# Patient Record
Sex: Female | Born: 1980 | Race: White | Hispanic: No | Marital: Single | Smoking: Former smoker
Health system: Southern US, Community
[De-identification: ages and names within clinical notes are randomized; demographics above are authoritative.]

## PROBLEM LIST (undated history)

## (undated) DIAGNOSIS — M419 Scoliosis, unspecified: Secondary | ICD-10-CM

## (undated) DIAGNOSIS — K589 Irritable bowel syndrome without diarrhea: Secondary | ICD-10-CM

## (undated) DIAGNOSIS — N390 Urinary tract infection, site not specified: Secondary | ICD-10-CM

---

## 2001-01-27 ENCOUNTER — Emergency Department (HOSPITAL_COMMUNITY): Admission: EM | Admit: 2001-01-27 | Discharge: 2001-01-27 | Payer: Self-pay | Admitting: Emergency Medicine

## 2005-06-19 ENCOUNTER — Other Ambulatory Visit: Admission: RE | Admit: 2005-06-19 | Discharge: 2005-06-19 | Payer: Self-pay | Admitting: Obstetrics and Gynecology

## 2007-12-21 ENCOUNTER — Inpatient Hospital Stay (HOSPITAL_COMMUNITY): Admission: AD | Admit: 2007-12-21 | Discharge: 2007-12-23 | Payer: Self-pay | Admitting: Obstetrics and Gynecology

## 2008-11-27 HISTORY — PX: SPINE SURGERY: SHX786

## 2009-10-31 ENCOUNTER — Emergency Department (HOSPITAL_BASED_OUTPATIENT_CLINIC_OR_DEPARTMENT_OTHER): Admission: EM | Admit: 2009-10-31 | Discharge: 2009-10-31 | Payer: Self-pay | Admitting: Emergency Medicine

## 2009-10-31 ENCOUNTER — Ambulatory Visit: Payer: Self-pay | Admitting: Interventional Radiology

## 2010-01-25 ENCOUNTER — Emergency Department (HOSPITAL_BASED_OUTPATIENT_CLINIC_OR_DEPARTMENT_OTHER): Admission: EM | Admit: 2010-01-25 | Discharge: 2010-01-25 | Payer: Self-pay | Admitting: Emergency Medicine

## 2010-06-30 ENCOUNTER — Ambulatory Visit: Payer: Self-pay | Admitting: Diagnostic Radiology

## 2010-06-30 ENCOUNTER — Emergency Department (HOSPITAL_BASED_OUTPATIENT_CLINIC_OR_DEPARTMENT_OTHER): Admission: EM | Admit: 2010-06-30 | Discharge: 2010-06-30 | Payer: Self-pay | Admitting: Emergency Medicine

## 2010-11-27 HISTORY — PX: OTHER SURGICAL HISTORY: SHX169

## 2011-02-28 LAB — PREGNANCY, URINE: Preg Test, Ur: NEGATIVE

## 2011-02-28 LAB — DIFFERENTIAL
Basophils Absolute: 0.1 10*3/uL (ref 0.0–0.1)
Basophils Relative: 1 % (ref 0–1)
Eosinophils Absolute: 0.1 10*3/uL (ref 0.0–0.7)
Eosinophils Relative: 1 % (ref 0–5)
Lymphocytes Relative: 26 % (ref 12–46)

## 2011-02-28 LAB — CBC: WBC: 7.7 10*3/uL (ref 4.0–10.5)

## 2011-02-28 LAB — URINALYSIS, ROUTINE W REFLEX MICROSCOPIC
Glucose, UA: NEGATIVE mg/dL
Ketones, ur: NEGATIVE mg/dL
Nitrite: NEGATIVE
Urobilinogen, UA: 0.2 mg/dL (ref 0.0–1.0)

## 2011-02-28 LAB — URINE MICROSCOPIC-ADD ON

## 2011-04-04 ENCOUNTER — Encounter (INDEPENDENT_AMBULATORY_CARE_PROVIDER_SITE_OTHER): Payer: Self-pay | Admitting: Surgery

## 2011-07-24 ENCOUNTER — Ambulatory Visit: Payer: Self-pay | Attending: Neurosurgery | Admitting: Physical Therapy

## 2011-08-15 ENCOUNTER — Ambulatory Visit: Payer: Self-pay | Admitting: Physical Therapy

## 2011-08-17 LAB — DIFFERENTIAL
Basophils Relative: 1
Eosinophils Absolute: 0.5
Lymphocytes Relative: 23
Lymphs Abs: 3.3
Monocytes Absolute: 0.9
Monocytes Relative: 6

## 2011-08-17 LAB — CBC
HCT: 33.9 — ABNORMAL LOW
HCT: 39.3
Hemoglobin: 11.7 — ABNORMAL LOW
MCHC: 34.5
MCV: 94
MCV: 94.7
MCV: 95.8
RBC: 3.58 — ABNORMAL LOW
RDW: 13.2
RDW: 13.3
WBC: 14.6 — ABNORMAL HIGH
WBC: 19.2 — ABNORMAL HIGH
WBC: 20.9 — ABNORMAL HIGH

## 2011-08-23 ENCOUNTER — Ambulatory Visit: Payer: Self-pay | Admitting: Physical Therapy

## 2011-08-30 ENCOUNTER — Ambulatory Visit: Payer: Self-pay | Attending: Neurosurgery | Admitting: Physical Therapy

## 2012-11-26 ENCOUNTER — Emergency Department (HOSPITAL_BASED_OUTPATIENT_CLINIC_OR_DEPARTMENT_OTHER): Payer: Medicaid Other

## 2012-11-26 ENCOUNTER — Encounter (HOSPITAL_BASED_OUTPATIENT_CLINIC_OR_DEPARTMENT_OTHER): Payer: Self-pay

## 2012-11-26 ENCOUNTER — Emergency Department (HOSPITAL_BASED_OUTPATIENT_CLINIC_OR_DEPARTMENT_OTHER)
Admission: EM | Admit: 2012-11-26 | Discharge: 2012-11-26 | Disposition: A | Discharge status: Home / Self Care | Payer: Medicaid Other | Attending: Emergency Medicine | Admitting: Emergency Medicine

## 2012-11-26 DIAGNOSIS — F17200 Nicotine dependence, unspecified, uncomplicated: Secondary | ICD-10-CM | POA: Insufficient documentation

## 2012-11-26 DIAGNOSIS — Z79899 Other long term (current) drug therapy: Secondary | ICD-10-CM | POA: Insufficient documentation

## 2012-11-26 DIAGNOSIS — M549 Dorsalgia, unspecified: Secondary | ICD-10-CM | POA: Insufficient documentation

## 2012-11-26 DIAGNOSIS — Z9889 Other specified postprocedural states: Secondary | ICD-10-CM | POA: Insufficient documentation

## 2012-11-26 MED ORDER — METHOCARBAMOL 500 MG PO TABS
ORAL_TABLET | ORAL | Status: AC
  Administered 2012-11-26: 500 mg
  Filled 2012-11-26: qty 1

## 2012-11-26 MED ORDER — HYDROCODONE-ACETAMINOPHEN 7.5-500 MG/15ML PO SOLN: 15.0000 mL | Freq: Four times a day (QID) | ORAL | Status: DC | PRN

## 2012-11-26 MED ORDER — HYDROCODONE-ACETAMINOPHEN 7.5-500 MG/15ML PO SOLN
ORAL | Status: AC
  Administered 2012-11-26: 15 mL
  Filled 2012-11-26: qty 15

## 2012-11-26 MED ORDER — METHOCARBAMOL 500 MG PO TABS: 500.0000 mg | ORAL_TABLET | Freq: Two times a day (BID) | ORAL | Status: DC

## 2012-11-26 NOTE — ED Provider Notes (Signed)
History     CSN: [id number]  Arrival date & time 11/26/12  1933   First MD Initiated Contact with Patient 11/26/12 2300      Chief Complaint  Patient presents with  . Back Pain    (Consider location/radiation/quality/duration/timing/severity/associated sxs/prior treatment) Patient is a 31 y.o. female presenting with back pain. The history is provided by the patient. No language interpreter was used.  Back Pain  This is a recurrent problem. The current episode started more than 2 days ago. The problem occurs constantly. The problem has not changed since onset.Associated with: pinned up against wall. The pain is present in the thoracic spine. The quality of the pain is described as stabbing. The pain does not radiate. The pain is severe. The symptoms are aggravated by bending. The pain is the same all the time. Pertinent negatives include no chest pain, no fever, no numbness, no weight loss, no headaches, no abdominal pain, no abdominal swelling, no bowel incontinence, no perianal numbness, no bladder incontinence, no dysuria, no pelvic pain, no leg pain, no paresthesias, no paresis, no tingling and no weakness. She has tried nothing for the symptoms. The treatment provided no relief.    History reviewed. No pertinent past medical history.  Past Surgical History  Procedure Date  . Spine surgery 2010    SCOLIOSIS SPINAL FUSION  . Hidranitis boil removal 2012    No family history on file.  History  Substance Use Topics  . Smoking status: Current Every Day Smoker -- 1.0 packs/day  . Smokeless tobacco: Not on file  . Alcohol Use: No    OB History    Grav Para Term Preterm Abortions TAB SAB Ect Mult Living                  Review of Systems  Constitutional: Negative for fever and weight loss.  Cardiovascular: Negative for chest pain.  Gastrointestinal: Negative for abdominal pain and bowel incontinence.  Genitourinary: Negative for bladder incontinence, dysuria and pelvic  pain.  Musculoskeletal: Positive for back pain.  Neurological: Negative for tingling, weakness, numbness, headaches and paresthesias.  All other systems reviewed and are negative.    Allergies  Review of patient's allergies indicates no known allergies.  Home Medications   Current Outpatient Rx  Name  Route  Sig  Dispense  Refill  . ALPRAZOLAM 1 MG PO TABS   Oral   Take 1 mg by mouth 2 (two) times daily.         . OXYCODONE HCL ER 15 MG PO TB12   Oral   Take 15 mg by mouth every 12 (twelve) hours.             BP 132/71  Pulse 89  Temp 98.9 F (37.2 C) (Oral)  Resp 18  Ht 5\' 9"  (1.753 m)  Wt 135 lb (61.236 kg)  BMI 19.94 kg/m2  SpO2 100%  Physical Exam  Constitutional: She is oriented to person, place, and time. She appears well-developed and well-nourished. No distress.  HENT:  Head: Normocephalic and atraumatic.  Mouth/Throat: Oropharynx is clear and moist.  Eyes: Conjunctivae normal and EOM are normal.  Neck: Normal range of motion. Neck supple.  Cardiovascular: Normal rate, regular rhythm and intact distal pulses.   Pulmonary/Chest: Effort normal and breath sounds normal. She has no wheezes. She has no rales.  Abdominal: Soft. Bowel sounds are normal. There is no tenderness. There is no rebound and no guarding.  Musculoskeletal: Normal range of motion. She exhibits  no edema and no tenderness.  Neurological: She is alert and oriented to person, place, and time. She has normal reflexes.  Skin: Skin is warm and dry.  Psychiatric: She has a normal mood and affect.    ED Course  Procedures (including critical care time)  Labs Reviewed - No data to display Dg Cervical Spine Complete  11/26/2012  *RADIOLOGY REPORT*  Clinical Data: Neck injury and neck pain.  CERVICAL SPINE - 4+ VIEWS  Comparison:  06/30/2010  Findings:  There is no evidence of cervical spine fracture or prevertebral soft tissue swelling.  Alignment is normal.  No other significant bone  abnormalities are identified.  IMPRESSION: Negative cervical spine radiographs.   Original Report Authenticated By: Myles Rosenthal, M.D.    Dg Thoracic Spine 2 View  11/26/2012  *RADIOLOGY REPORT*  Clinical Data: Back injury and thoracic back pain.  THORACIC SPINE - 2 VIEW  Comparison: 06/30/2010  Findings: Posterior spinal fixation rods are again seen extending from T2-L2.  No evidence of hardware failure or loosening.  Mild thoracic dextroscoliosis appears stable.  No evidence of thoracic spine fracture or subluxation.  No other significant bone abnormality identified.  IMPRESSION:  1.  No acute findings. 2.  Stable appearance of the posterior spinal fixation rods from levels of T2-L2.   Original Report Authenticated By: Myles Rosenthal, M.D.      No diagnosis found.    MDM  Follow up with your spine provider on Thursday.          Jasmine Awe, MD 11/26/12 2306

## 2012-11-26 NOTE — ED Notes (Signed)
Was pinned by a horse into wall on 12/28-c/o pain to upper back-hx of surgery to area

## 2013-02-09 ENCOUNTER — Emergency Department (HOSPITAL_BASED_OUTPATIENT_CLINIC_OR_DEPARTMENT_OTHER)
Admission: EM | Admit: 2013-02-09 | Discharge: 2013-02-09 | Disposition: A | Discharge status: Home / Self Care | Payer: Medicaid Other | Attending: Emergency Medicine | Admitting: Emergency Medicine

## 2013-02-09 ENCOUNTER — Encounter (HOSPITAL_BASED_OUTPATIENT_CLINIC_OR_DEPARTMENT_OTHER): Payer: Self-pay | Admitting: *Deleted

## 2013-02-09 ENCOUNTER — Emergency Department (HOSPITAL_BASED_OUTPATIENT_CLINIC_OR_DEPARTMENT_OTHER): Payer: Medicaid Other

## 2013-02-09 DIAGNOSIS — Y939 Activity, unspecified: Secondary | ICD-10-CM | POA: Insufficient documentation

## 2013-02-09 DIAGNOSIS — F17200 Nicotine dependence, unspecified, uncomplicated: Secondary | ICD-10-CM | POA: Insufficient documentation

## 2013-02-09 DIAGNOSIS — S0990XA Unspecified injury of head, initial encounter: Secondary | ICD-10-CM | POA: Insufficient documentation

## 2013-02-09 DIAGNOSIS — Z79899 Other long term (current) drug therapy: Secondary | ICD-10-CM | POA: Insufficient documentation

## 2013-02-09 DIAGNOSIS — W1809XA Striking against other object with subsequent fall, initial encounter: Secondary | ICD-10-CM | POA: Insufficient documentation

## 2013-02-09 DIAGNOSIS — Y9289 Other specified places as the place of occurrence of the external cause: Secondary | ICD-10-CM | POA: Insufficient documentation

## 2013-02-09 MED ORDER — OXYCODONE-ACETAMINOPHEN 5-325 MG PO TABS
1.0000 | ORAL_TABLET | Freq: Once | ORAL | Status: AC
  Administered 2013-02-09: 1 via ORAL
  Filled 2013-02-09 (×2): qty 1

## 2013-02-09 MED ORDER — IBUPROFEN 600 MG PO TABS: 600.0000 mg | ORAL_TABLET | Freq: Four times a day (QID) | ORAL | Status: DC | PRN

## 2013-02-09 MED ORDER — ONDANSETRON HCL 4 MG PO TABS: 4.0000 mg | ORAL_TABLET | Freq: Four times a day (QID) | ORAL | Status: DC

## 2013-02-09 NOTE — ED Notes (Signed)
Pt returned from CT °

## 2013-02-09 NOTE — ED Notes (Signed)
Patient transported to CT 

## 2013-02-09 NOTE — ED Provider Notes (Signed)
History     CSN: [id number]  Arrival date & time 02/09/13  0424   First MD Initiated Contact with Patient 02/09/13 [phone]      Chief Complaint  Patient presents with  . Head Injury    (Consider location/radiation/quality/duration/timing/severity/associated sxs/prior treatment) HPI Pt presents with c/o fall and hitting the back of her head.  She states she tripped over some items on her porch causing her to fall.  She states she did have brief LOC, and awoke on the ground.  No report of seizure activity, no vomiting.  Has had intermittent dizziness and headache.  No neck or back pain.  Denies substance use or etoh tonight.  No changes in vision.  She has not had any treatment prior to arrival. There are no other associated systemic symptoms, there are no other alleviating or modifying factors.   History reviewed. No pertinent past medical history.  Past Surgical History  Procedure Laterality Date  . Spine surgery  2010    SCOLIOSIS SPINAL FUSION  . Hidranitis boil removal  2012    No family history on file.  History  Substance Use Topics  . Smoking status: Current Every Day Smoker -- 1.00 packs/day  . Smokeless tobacco: Not on file  . Alcohol Use: No    OB History   Grav Para Term Preterm Abortions TAB SAB Ect Mult Living                  Review of Systems ROS reviewed and all otherwise negative except for mentioned in HPI   Allergies  Review of patient's allergies indicates no known allergies.  Home Medications   Current Outpatient Rx  Name  Route  Sig  Dispense  Refill  . ALPRAZolam (XANAX) 1 MG tablet   Oral   Take 1 mg by mouth 2 (two) times daily.         Marland Kitchen HYDROcodone-acetaminophen (LORTAB) 7.5-500 MG/15ML solution   Oral   Take 15 mLs by mouth every 6 (six) hours as needed for pain.   60 mL   0   . ibuprofen (ADVIL,MOTRIN) 600 MG tablet   Oral   Take 1 tablet (600 mg total) by mouth every 6 (six) hours as needed for pain.   30 tablet   0   .  methocarbamol (ROBAXIN) 500 MG tablet   Oral   Take 1 tablet (500 mg total) by mouth 2 (two) times daily.   20 tablet   0   . ondansetron (ZOFRAN) 4 MG tablet   Oral   Take 1 tablet (4 mg total) by mouth every 6 (six) hours.   12 tablet   0   . oxyCODONE (OXYCONTIN) 15 MG TB12   Oral   Take 15 mg by mouth every 12 (twelve) hours.             BP 108/67  Pulse 80  Temp(Src) 98.2 F (36.8 C) (Oral)  Resp 17  Ht 5\' 9"  (1.753 m)  Wt 150 lb (68.04 kg)  BMI 22.14 kg/m2  SpO2 99% Vitals reviewed Physical Exam Physical Examination: General appearance - alert, well appearing, and in no distress Mental status - alert, oriented to person, place, and time Eyes - pupils equal and reactive, extraocular eye movements intact Ears - bilateral TM's and external ear canals normal, no hemotympanum Neck - no midline tenderness to palpation, FROM without pain Chest - clear to auscultation, no wheezes, rales or rhonchi, symmetric air entry Heart - normal rate,  regular rhythm, normal S1, S2, no murmurs, rubs, clicks or gallops Back exam - no midline tenderness to palpation, no paraspinal tenderness Neurological - alert, oriented x 3, normal speech, strength and sensation grossly intact Musculoskeletal - no joint tenderness, deformity or swelling Extremities - peripheral pulses normal, no pedal edema, no clubbing or cyanosis Skin - normal coloration and turgor, no rashes  ED Course  Procedures (including critical care time)  Labs Reviewed - No data to display Ct Head Wo Contrast  02/09/2013  *RADIOLOGY REPORT*  Clinical Data: The patient fell, striking back of the head. Dizziness and loss of consciousness.  CT HEAD WITHOUT CONTRAST  Technique:  Contiguous axial images were obtained from the base of the skull through the vertex without contrast.  Comparison: None.  Findings: The ventricles and sulci are symmetrical without significant effacement, displacement, or dilatation. No mass effect or  midline shift. No abnormal extra-axial fluid collections. The grey-white matter junction is distinct. Basal cisterns are not effaced. No acute intracranial hemorrhage. No depressed skull fractures.  Visualized mastoid air cells and paranasal sinuses are not opacified.  IMPRESSION: No acute intracranial abnormalities.   Original Report Authenticated By: Burman Nieves, M.D.      1. Head injury, initial encounter       MDM  Pt presenting after fall and head injury with brief LOC.  Head CT reassuring.  Pt advised of postconcussive symptoms.  Given rx for ibuprofen and zofran.  Discharged with strict return precautions.  Pt agreeable with plan.        Ethelda Chick, MD 02/09/13 [phone]

## 2013-02-09 NOTE — ED Notes (Signed)
MD at bedside. 

## 2013-02-09 NOTE — ED Notes (Signed)
Pt c/o of headache. Pt fell and hit the back of her head on a dresser at 0100. Lost consciousness for app 2 minutes. She states dizziness comes and goes. No bruising/bleeding/swelling noted. Denies nausea/vomiting.

## 2013-04-19 ENCOUNTER — Emergency Department (HOSPITAL_BASED_OUTPATIENT_CLINIC_OR_DEPARTMENT_OTHER)
Admission: EM | Admit: 2013-04-19 | Discharge: 2013-04-19 | Disposition: A | Discharge status: Home / Self Care | Payer: Medicaid Other | Attending: Emergency Medicine | Admitting: Emergency Medicine

## 2013-04-19 ENCOUNTER — Encounter (HOSPITAL_BASED_OUTPATIENT_CLINIC_OR_DEPARTMENT_OTHER): Payer: Self-pay

## 2013-04-19 DIAGNOSIS — R509 Fever, unspecified: Secondary | ICD-10-CM | POA: Insufficient documentation

## 2013-04-19 DIAGNOSIS — R1310 Dysphagia, unspecified: Secondary | ICD-10-CM | POA: Insufficient documentation

## 2013-04-19 DIAGNOSIS — J029 Acute pharyngitis, unspecified: Secondary | ICD-10-CM | POA: Insufficient documentation

## 2013-04-19 DIAGNOSIS — R5381 Other malaise: Secondary | ICD-10-CM | POA: Insufficient documentation

## 2013-04-19 DIAGNOSIS — R599 Enlarged lymph nodes, unspecified: Secondary | ICD-10-CM | POA: Insufficient documentation

## 2013-04-19 DIAGNOSIS — Z79899 Other long term (current) drug therapy: Secondary | ICD-10-CM | POA: Insufficient documentation

## 2013-04-19 DIAGNOSIS — F17200 Nicotine dependence, unspecified, uncomplicated: Secondary | ICD-10-CM | POA: Insufficient documentation

## 2013-04-19 MED ORDER — PREDNISONE 50 MG PO TABS
60.0000 mg | ORAL_TABLET | Freq: Once | ORAL | Status: AC
  Administered 2013-04-19: 60 mg via ORAL
  Filled 2013-04-19: qty 1

## 2013-04-19 NOTE — ED Notes (Signed)
Pt states that she has severe sore throat, believes she has tonsillitis.  Pt states that she cannot swallow because of it, but is drinking a soft drink.  Pt states that she was going to go to [facility] on Monday, but decided to come to ED because she felt like she couldn't breathe.

## 2013-04-19 NOTE — ED Provider Notes (Signed)
History     CSN: [id number]  Arrival date & time 04/19/13  1349   First MD Initiated Contact with Patient 04/19/13 1411      Chief Complaint  Patient presents with  . Sore Throat    (Consider location/radiation/quality/duration/timing/severity/associated sxs/prior treatment) HPI Comments: Patient presents with a 2 to three-day history of sore throat. She states it's been getting worse over the last 2 days. She's had some subjective fevers. She denies any runny nose or nasal congestion. She denies any cough or chest congestion. She's having trouble swallowing but no trouble controlling her secretions. She denies any rashes. She denies abdominal pain.  Patient is a 32 y.o. female presenting with pharyngitis.  Sore Throat Pertinent negatives include no chest pain, no abdominal pain, no headaches and no shortness of breath.    History reviewed. No pertinent past medical history.  Past Surgical History  Procedure Laterality Date  . Spine surgery  2010    SCOLIOSIS SPINAL FUSION  . Hidranitis boil removal  2012    History reviewed. No pertinent family history.  History  Substance Use Topics  . Smoking status: Current Every Day Smoker -- 1.00 packs/day    Types: Cigarettes  . Smokeless tobacco: Never Used  . Alcohol Use: No    OB History   Grav Para Term Preterm Abortions TAB SAB Ect Mult Living                  Review of Systems  Constitutional: Positive for fever and fatigue. Negative for chills and diaphoresis.  HENT: Positive for sore throat and trouble swallowing. Negative for congestion, rhinorrhea, sneezing, mouth sores and voice change.   Eyes: Negative.   Respiratory: Negative for cough, chest tightness and shortness of breath.   Cardiovascular: Negative for chest pain and leg swelling.  Gastrointestinal: Negative for nausea, vomiting, abdominal pain, diarrhea and blood in stool.  Genitourinary: Negative for frequency, hematuria, flank pain and difficulty  urinating.  Musculoskeletal: Negative for back pain and arthralgias.  Skin: Negative for rash.  Neurological: Negative for dizziness, speech difficulty, weakness, numbness and headaches.    Allergies  Review of patient's allergies indicates no known allergies.  Home Medications   Current Outpatient Rx  Name  Route  Sig  Dispense  Refill  . ALPRAZolam (XANAX) 1 MG tablet   Oral   Take 1 mg by mouth 2 (two) times daily.         Marland Kitchen oxyCODONE (OXYCONTIN) 15 MG TB12   Oral   Take 15 mg by mouth every 12 (twelve) hours.           Marland Kitchen HYDROcodone-acetaminophen (LORTAB) 7.5-500 MG/15ML solution   Oral   Take 15 mLs by mouth every 6 (six) hours as needed for pain.   60 mL   0   . ibuprofen (ADVIL,MOTRIN) 600 MG tablet   Oral   Take 1 tablet (600 mg total) by mouth every 6 (six) hours as needed for pain.   30 tablet   0   . methocarbamol (ROBAXIN) 500 MG tablet   Oral   Take 1 tablet (500 mg total) by mouth 2 (two) times daily.   20 tablet   0   . ondansetron (ZOFRAN) 4 MG tablet   Oral   Take 1 tablet (4 mg total) by mouth every 6 (six) hours.   12 tablet   0     BP 123/79  Pulse 106  Temp(Src) 98.8 F (37.1 C) (Oral)  Resp 20  Ht 5\' 9"  (1.753 m)  Wt 145 lb (65.772 kg)  BMI 21.4 kg/m2  SpO2 96%  Physical Exam  Constitutional: She is oriented to person, place, and time. She appears well-developed and well-nourished.  HENT:  Head: Normocephalic and atraumatic.  Right Ear: External ear normal.  Left Ear: External ear normal.  Patient with enlargement of the tonsils bilaterally. There is exudate bilaterally. Uvula is midline with no fullness in the peritonsillar area. There is mild cervical lymphadenopathy bilaterally.  Eyes: Conjunctivae are normal. Pupils are equal, round, and reactive to light.  Neck: Normal range of motion. Neck supple.  Cardiovascular: Normal rate, regular rhythm and normal heart sounds.   Pulmonary/Chest: Effort normal and breath sounds  normal. No respiratory distress. She has no wheezes. She has no rales. She exhibits no tenderness.  Abdominal: Soft. Bowel sounds are normal. There is no tenderness. There is no rebound and no guarding.  Musculoskeletal: Normal range of motion. She exhibits no edema.  Lymphadenopathy:    She has cervical adenopathy.  Neurological: She is alert and oriented to person, place, and time.  Skin: Skin is warm and dry. No rash noted.  Psychiatric: She has a normal mood and affect.    ED Course  Procedures (including critical care time)  Results for orders placed during the hospital encounter of 04/19/13  RAPID STREP SCREEN      Result Value Range   Streptococcus, Group A Screen (Direct) NEGATIVE  NEGATIVE   No results found.    1. Pharyngitis       MDM  Patient's rapid strep is negative. It was sent for culture. She was given a dose of steroids she her in the ED for inflammation. She's not given a prescription for steroids. She was advised to continue her saltwater gargles and to followup with her Dr. on Tuesday if her symptoms are not improved. She is advised to return here for symptoms worsen.        Rolan Bucco, MD 04/19/13 [phone]

## 2013-04-21 LAB — CULTURE, GROUP A STREP

## 2013-11-25 ENCOUNTER — Ambulatory Visit: Payer: Medicaid Other | Attending: Anesthesiology

## 2014-01-17 ENCOUNTER — Emergency Department (HOSPITAL_BASED_OUTPATIENT_CLINIC_OR_DEPARTMENT_OTHER)
Admission: EM | Admit: 2014-01-17 | Discharge: 2014-01-18 | Disposition: A | Discharge status: Home / Self Care | Payer: Medicaid Other | Attending: Emergency Medicine | Admitting: Emergency Medicine

## 2014-01-17 ENCOUNTER — Encounter (HOSPITAL_BASED_OUTPATIENT_CLINIC_OR_DEPARTMENT_OTHER): Payer: Self-pay | Admitting: Emergency Medicine

## 2014-01-17 DIAGNOSIS — Z975 Presence of (intrauterine) contraceptive device: Secondary | ICD-10-CM | POA: Insufficient documentation

## 2014-01-17 DIAGNOSIS — F17200 Nicotine dependence, unspecified, uncomplicated: Secondary | ICD-10-CM | POA: Insufficient documentation

## 2014-01-17 DIAGNOSIS — N390 Urinary tract infection, site not specified: Secondary | ICD-10-CM | POA: Insufficient documentation

## 2014-01-17 DIAGNOSIS — Z79899 Other long term (current) drug therapy: Secondary | ICD-10-CM | POA: Insufficient documentation

## 2014-01-17 DIAGNOSIS — Z3202 Encounter for pregnancy test, result negative: Secondary | ICD-10-CM | POA: Insufficient documentation

## 2014-01-17 NOTE — ED Notes (Signed)
C/o onset burning, frequency with urination.  C/o right sided flank pain.  Denies fever, nausea, vomiting.  Denies abnormal vaginal discharge.

## 2014-01-17 NOTE — ED Provider Notes (Signed)
CSN: [id number]     Arrival date & time 01/17/14  2346 History  This chart was scribed for Geoffery Lyons, MD by Dorothey Baseman, ED Scribe. This patient was seen in room MH04/MH04 and the patient's care was started at 12:09 AM.    Chief Complaint  Patient presents with  . Dysuria   The history is provided by the patient. No language interpreter was used.   HPI Comments: Julie Pratt is a 33 y.o. female who presents to the Emergency Department complaining of burning dysuria with associated urgency, right flank pain, and diffuse abdominal pain onset a few days ago. She reports taking Azo at home without significant relief. She denies fever, emesis. Patient reports that she uses Mirena IUD for birth control and subsequently has not had a menstrual period for several years. Patient has no other pertinent medical history.   History reviewed. No pertinent past medical history. Past Surgical History  Procedure Laterality Date  . Spine surgery  2010    SCOLIOSIS SPINAL FUSION  . Hidranitis boil removal  2012   No family history on file. History  Substance Use Topics  . Smoking status: Current Every Day Smoker -- 1.00 packs/day    Types: Cigarettes  . Smokeless tobacco: Never Used  . Alcohol Use: No   OB History   Grav Para Term Preterm Abortions TAB SAB Ect Mult Living                 Review of Systems  A complete 10 system review of systems was obtained and all systems are negative except as noted in the HPI and PMH.    Allergies  Review of patient's allergies indicates no known allergies.  Home Medications   Current Outpatient Rx  Name  Route  Sig  Dispense  Refill  . ALPRAZolam (XANAX) 1 MG tablet   Oral   Take 1 mg by mouth 2 (two) times daily.         Marland Kitchen HYDROcodone-acetaminophen (LORTAB) 7.5-500 MG/15ML solution   Oral   Take 15 mLs by mouth every 6 (six) hours as needed for pain.   60 mL   0   . ibuprofen (ADVIL,MOTRIN) 600 MG tablet   Oral   Take 1 tablet  (600 mg total) by mouth every 6 (six) hours as needed for pain.   30 tablet   0   . methocarbamol (ROBAXIN) 500 MG tablet   Oral   Take 1 tablet (500 mg total) by mouth 2 (two) times daily.   20 tablet   0   . ondansetron (ZOFRAN) 4 MG tablet   Oral   Take 1 tablet (4 mg total) by mouth every 6 (six) hours.   12 tablet   0   . oxyCODONE (OXYCONTIN) 15 MG TB12   Oral   Take 15 mg by mouth every 12 (twelve) hours.            Triage Vitals: BP 106/72  Pulse 104  Temp(Src) 97.7 F (36.5 C) (Oral)  Resp 20  Ht 5\' 9"  (1.753 m)  Wt 145 lb (65.772 kg)  BMI 21.40 kg/m2  SpO2 96%  Physical Exam  Nursing note and vitals reviewed. Constitutional: She is oriented to person, place, and time. She appears well-developed and well-nourished. No distress.  HENT:  Head: Normocephalic and atraumatic.  Eyes: Conjunctivae are normal.  Neck: Normal range of motion. Neck supple.  Cardiovascular: Normal rate, regular rhythm and normal heart sounds.   Pulmonary/Chest: Effort normal  and breath sounds normal. No respiratory distress.  Abdominal: Soft. Bowel sounds are normal. She exhibits no distension. There is no tenderness.  No CVA tenderness.   Musculoskeletal: Normal range of motion.  Neurological: She is alert and oriented to person, place, and time.  Skin: Skin is warm and dry.  Psychiatric: She has a normal mood and affect. Her behavior is normal.    ED Course  Procedures (including critical care time)  DIAGNOSTIC STUDIES: Oxygen Saturation is 96% on room air, normal by my interpretation.    COORDINATION OF CARE: 12:11 AM- Ordered UA. Discussed treatment plan with patient at bedside and patient verbalized agreement.     Labs Review Labs Reviewed  URINALYSIS, ROUTINE W REFLEX MICROSCOPIC  PREGNANCY, URINE   Imaging Review No results found.    MDM   Final diagnoses:  None    Patient is a 33 year old female who presents with dysuria, burning, frequency. She's had  no relief with azo otc.  She denies fevers or chills. UA reveals a UTI. We'll treat with Bactrim pyridium. She is to return as needed for any problems.  I personally performed the services described in this documentation, which was scribed in my presence. The recorded information has been reviewed and is accurate.       Geoffery Lyonsouglas Dalon Reichart, MD 01/18/14 [phone]

## 2014-01-18 LAB — URINE MICROSCOPIC-ADD ON

## 2014-01-18 LAB — URINALYSIS, ROUTINE W REFLEX MICROSCOPIC
GLUCOSE, UA: NEGATIVE mg/dL
Ketones, ur: 40 mg/dL — AB
Nitrite: POSITIVE — AB
PH: 5 (ref 5.0–8.0)
SPECIFIC GRAVITY, URINE: 1.029 (ref 1.005–1.030)
UROBILINOGEN UA: 2 mg/dL — AB (ref 0.0–1.0)

## 2014-01-18 LAB — PREGNANCY, URINE: PREG TEST UR: NEGATIVE

## 2014-01-18 MED ORDER — PHENAZOPYRIDINE HCL 200 MG PO TABS: 200.0000 mg | ORAL_TABLET | Freq: Three times a day (TID) | ORAL | Status: DC | PRN

## 2014-01-18 MED ORDER — HYDROCODONE-ACETAMINOPHEN 5-325 MG PO TABS: 2.0000 | ORAL_TABLET | ORAL | Status: DC | PRN

## 2014-01-18 MED ORDER — SULFAMETHOXAZOLE-TRIMETHOPRIM 800-160 MG PO TABS: 1.0000 | ORAL_TABLET | Freq: Two times a day (BID) | ORAL | Status: AC

## 2014-01-18 NOTE — ED Notes (Signed)
Pt states that she has been having symptoms of frequency, pressure when urinating and painful urination. Pt has been taking Azos to help with the pain but still having burning.

## 2014-01-18 NOTE — Discharge Instructions (Signed)
Bactrim and Pyridium as prescribed.  Return to the Emergency Department for severe pain, high fever, other new and concerning symptoms.   Urinary Tract Infection Urinary tract infections (UTIs) can develop anywhere along your urinary tract. Your urinary tract is your body's drainage system for removing wastes and extra water. Your urinary tract includes two kidneys, two ureters, a bladder, and a urethra. Your kidneys are a pair of bean-shaped organs. Each kidney is about the size of your fist. They are located below your ribs, one on each side of your spine. CAUSES Infections are caused by microbes, which are microscopic organisms, including fungi, viruses, and bacteria. These organisms are so small that they can only be seen through a microscope. Bacteria are the microbes that most commonly cause UTIs. SYMPTOMS  Symptoms of UTIs may vary by age and gender of the patient and by the location of the infection. Symptoms in young women typically include a frequent and intense urge to urinate and a painful, burning feeling in the bladder or urethra during urination. Older women and men are more likely to be tired, shaky, and weak and have muscle aches and abdominal pain. A fever may mean the infection is in your kidneys. Other symptoms of a kidney infection include pain in your back or sides below the ribs, nausea, and vomiting. DIAGNOSIS To diagnose a UTI, your caregiver will ask you about your symptoms. Your caregiver also will ask to provide a urine sample. The urine sample will be tested for bacteria and white blood cells. White blood cells are made by your body to help fight infection. TREATMENT  Typically, UTIs can be treated with medication. Because most UTIs are caused by a bacterial infection, they usually can be treated with the use of antibiotics. The choice of antibiotic and length of treatment depend on your symptoms and the type of bacteria causing your infection. HOME CARE INSTRUCTIONS  If  you were prescribed antibiotics, take them exactly as your caregiver instructs you. Finish the medication even if you feel better after you have only taken some of the medication.  Drink enough water and fluids to keep your urine clear or pale yellow.  Avoid caffeine, tea, and carbonated beverages. They tend to irritate your bladder.  Empty your bladder often. Avoid holding urine for long periods of time.  Empty your bladder before and after sexual intercourse.  After a bowel movement, women should cleanse from front to back. Use each tissue only once. SEEK MEDICAL CARE IF:   You have back pain.  You develop a fever.  Your symptoms do not begin to resolve within 3 days. SEEK IMMEDIATE MEDICAL CARE IF:   You have severe back pain or lower abdominal pain.  You develop chills.  You have nausea or vomiting.  You have continued burning or discomfort with urination. MAKE SURE YOU:   Understand these instructions.  Will watch your condition.  Will get help right away if you are not doing well or get worse. Document Released: 08/23/2005 Document Revised: 05/14/2012 Document Reviewed: 12/22/2011 [facility] Patient Information 2014 [employer] [place], [place].

## 2014-01-21 ENCOUNTER — Encounter (HOSPITAL_BASED_OUTPATIENT_CLINIC_OR_DEPARTMENT_OTHER): Payer: Self-pay | Admitting: Emergency Medicine

## 2014-01-21 ENCOUNTER — Emergency Department (HOSPITAL_BASED_OUTPATIENT_CLINIC_OR_DEPARTMENT_OTHER)
Admission: EM | Admit: 2014-01-21 | Discharge: 2014-01-21 | Disposition: A | Discharge status: Home / Self Care | Payer: Medicaid Other | Attending: Emergency Medicine | Admitting: Emergency Medicine

## 2014-01-21 DIAGNOSIS — F17200 Nicotine dependence, unspecified, uncomplicated: Secondary | ICD-10-CM | POA: Insufficient documentation

## 2014-01-21 DIAGNOSIS — Z79899 Other long term (current) drug therapy: Secondary | ICD-10-CM | POA: Insufficient documentation

## 2014-01-21 DIAGNOSIS — G8929 Other chronic pain: Secondary | ICD-10-CM | POA: Insufficient documentation

## 2014-01-21 DIAGNOSIS — Z9889 Other specified postprocedural states: Secondary | ICD-10-CM | POA: Insufficient documentation

## 2014-01-21 DIAGNOSIS — A499 Bacterial infection, unspecified: Secondary | ICD-10-CM | POA: Insufficient documentation

## 2014-01-21 DIAGNOSIS — Z3202 Encounter for pregnancy test, result negative: Secondary | ICD-10-CM | POA: Insufficient documentation

## 2014-01-21 DIAGNOSIS — Z87440 Personal history of urinary (tract) infections: Secondary | ICD-10-CM | POA: Insufficient documentation

## 2014-01-21 DIAGNOSIS — B9689 Other specified bacterial agents as the cause of diseases classified elsewhere: Secondary | ICD-10-CM | POA: Insufficient documentation

## 2014-01-21 DIAGNOSIS — M549 Dorsalgia, unspecified: Secondary | ICD-10-CM | POA: Insufficient documentation

## 2014-01-21 DIAGNOSIS — N760 Acute vaginitis: Secondary | ICD-10-CM | POA: Insufficient documentation

## 2014-01-21 HISTORY — DX: Scoliosis, unspecified: M41.9

## 2014-01-21 LAB — PREGNANCY, URINE: Preg Test, Ur: NEGATIVE

## 2014-01-21 LAB — URINALYSIS, ROUTINE W REFLEX MICROSCOPIC
BILIRUBIN URINE: NEGATIVE
GLUCOSE, UA: NEGATIVE mg/dL
HGB URINE DIPSTICK: NEGATIVE
KETONES UR: NEGATIVE mg/dL
Leukocytes, UA: NEGATIVE
Nitrite: NEGATIVE
PROTEIN: NEGATIVE mg/dL
Specific Gravity, Urine: 1.013 (ref 1.005–1.030)
Urobilinogen, UA: 1 mg/dL (ref 0.0–1.0)
pH: 7.5 (ref 5.0–8.0)

## 2014-01-21 LAB — WET PREP, GENITAL
TRICH WET PREP: NONE SEEN
Yeast Wet Prep HPF POC: NONE SEEN

## 2014-01-21 MED ORDER — METRONIDAZOLE 500 MG PO TABS: 500.0000 mg | ORAL_TABLET | Freq: Two times a day (BID) | ORAL | Status: DC

## 2014-01-21 MED ORDER — OXYCODONE-ACETAMINOPHEN 5-325 MG PO TABS: 1.0000 | ORAL_TABLET | ORAL | Status: DC | PRN

## 2014-01-21 NOTE — ED Provider Notes (Signed)
CSN: [id number]     Arrival date & time 01/21/14  1159 History   First MD Initiated Contact with Patient 01/21/14 1204     Chief Complaint  Patient presents with  . Back Pain     (Consider location/radiation/quality/duration/timing/severity/associated sxs/prior Treatment) HPI Comments: Pt states that she was seen for a uti 3 days ago in the er and the symptoms weren't getting any better. Pt states that she has some burning still and lower abdominal pressure. Pt states that she is also having some back pain but she has chronic back pain associated with her surgery for sciolosis and her rods. Denies fever, numbness or weakness  The history is provided by the patient. No language interpreter was used.    Past Medical History  Diagnosis Date  . Scoliosis    Past Surgical History  Procedure Laterality Date  . Spine surgery  2010    SCOLIOSIS SPINAL FUSION  . Hidranitis boil removal  2012   No family history on file. History  Substance Use Topics  . Smoking status: Current Every Day Smoker -- 1.00 packs/day    Types: Cigarettes  . Smokeless tobacco: Never Used  . Alcohol Use: No   OB History   Grav Para Term Preterm Abortions TAB SAB Ect Mult Living                 Review of Systems  Constitutional: Negative.   Respiratory: Negative.   Cardiovascular: Negative.       Allergies  Review of patient's allergies indicates no known allergies.  Home Medications   Current Outpatient Rx  Name  Route  Sig  Dispense  Refill  . ALPRAZolam (XANAX) 1 MG tablet   Oral   Take 1 mg by mouth 2 (two) times daily.         Marland Kitchen. HYDROcodone-acetaminophen (NORCO) 5-325 MG per tablet   Oral   Take 2 tablets by mouth every 4 (four) hours as needed.   10 tablet   0   . phenazopyridine (PYRIDIUM) 200 MG tablet   Oral   Take 1 tablet (200 mg total) by mouth 3 (three) times daily as needed for pain.   6 tablet   0   . sulfamethoxazole-trimethoprim (BACTRIM DS,SEPTRA DS) 800-160 MG  per tablet   Oral   Take 1 tablet by mouth 2 (two) times daily.   10 tablet   0   . HYDROcodone-acetaminophen (LORTAB) 7.5-500 MG/15ML solution   Oral   Take 15 mLs by mouth every 6 (six) hours as needed for pain.   60 mL   0   . ibuprofen (ADVIL,MOTRIN) 600 MG tablet   Oral   Take 1 tablet (600 mg total) by mouth every 6 (six) hours as needed for pain.   30 tablet   0   . methocarbamol (ROBAXIN) 500 MG tablet   Oral   Take 1 tablet (500 mg total) by mouth 2 (two) times daily.   20 tablet   0   . metroNIDAZOLE (FLAGYL) 500 MG tablet   Oral   Take 1 tablet (500 mg total) by mouth 2 (two) times daily.   14 tablet   0   . ondansetron (ZOFRAN) 4 MG tablet   Oral   Take 1 tablet (4 mg total) by mouth every 6 (six) hours.   12 tablet   0   . oxyCODONE (OXYCONTIN) 15 MG TB12   Oral   Take 15 mg by mouth every 12 (twelve) hours.           .Marland Kitchen  oxyCODONE-acetaminophen (PERCOCET/ROXICET) 5-325 MG per tablet   Oral   Take 1-2 tablets by mouth every 4 (four) hours as needed for severe pain.   6 tablet   0    BP 131/74  Pulse 73  Temp(Src) 98.3 F (36.8 C) (Oral)  Resp 18  Ht 5\' 9"  (1.753 m)  Wt 145 lb (65.772 kg)  BMI 21.40 kg/m2  SpO2 98% Physical Exam  Nursing note and vitals reviewed. Constitutional: She is oriented to person, place, and time. She appears well-developed and well-nourished.  HENT:  Head: Normocephalic and atraumatic.  Cardiovascular: Normal rate.   Pulmonary/Chest: Effort normal and breath sounds normal.  Abdominal: Soft. Bowel sounds are normal.  Suprapubic tenderness  Genitourinary:  Manson Passey discharge  Musculoskeletal: Normal range of motion.  Lumbar paraspinal tenderness  Neurological: She is alert and oriented to person, place, and time. Coordination normal.  Skin: Skin is warm and dry.    ED Course  Procedures (including critical care time) Labs Review Labs Reviewed  WET PREP, GENITAL - Abnormal; Notable for the following:    Clue  Cells Wet Prep HPF POC FEW (*)    WBC, Wet Prep HPF POC MANY (*)    All other components within normal limits  GC/CHLAMYDIA PROBE AMP  PREGNANCY, URINE  URINALYSIS, ROUTINE W REFLEX MICROSCOPIC   Imaging Review No results found.  EKG Interpretation   None       MDM   Final diagnoses:  Back pain  BV (bacterial vaginosis)    Will treat with something for pain and for ZO:XWRUE is better:back pain likely to chronic pain in back    Teressa Lower, NP 01/21/14 1328

## 2014-01-21 NOTE — ED Notes (Signed)
D/c homw with rx x 2 given for percocet and flagyl

## 2014-01-21 NOTE — ED Notes (Signed)
Back pain increasing last night- also recent dx with UTI but states sx not improving

## 2014-01-21 NOTE — Discharge Instructions (Signed)
Back Pain, Adult Low back pain is very common. About 1 in 5 people have back pain.The cause of low back pain is rarely dangerous. The pain often gets better over time.About half of people with a sudden onset of back pain feel better in just 2 weeks. About 8 in 10 people feel better by 6 weeks.  CAUSES Some common causes of back pain include:  Strain of the muscles or ligaments supporting the spine.  Wear and tear (degeneration) of the spinal discs.  Arthritis.  Direct injury to the back. DIAGNOSIS Most of the time, the direct cause of low back pain is not known.However, back pain can be treated effectively even when the exact cause of the pain is unknown.Answering your caregiver's questions about your overall health and symptoms is one of the most accurate ways to make sure the cause of your pain is not dangerous. If your caregiver needs more information, he or she may order lab work or imaging tests (X-rays or MRIs).However, even if imaging tests show changes in your back, this usually does not require surgery. HOME CARE INSTRUCTIONS For many people, back pain returns.Since low back pain is rarely dangerous, it is often a condition that people can learn to [facility] their own.   Remain active. It is stressful on the back to sit or stand in one place. Do not sit, drive, or stand in one place for more than 30 minutes at a time. Take short walks on level surfaces as soon as pain allows.Try to increase the length of time you walk each day.  Do not stay in bed.Resting more than 1 or 2 days can delay your recovery.  Do not avoid exercise or work.Your body is made to move.It is not dangerous to be active, even though your back may hurt.Your back will likely heal faster if you return to being active before your pain is gone.  Pay attention to your body when you bend and lift. Many people have less discomfortwhen lifting if they bend their knees, keep the load close to their bodies,and  avoid twisting. Often, the most comfortable positions are those that put less stress on your recovering back.  Find a comfortable position to sleep. Use a firm mattress and lie on your side with your knees slightly bent. If you lie on your back, put a pillow under your knees.  Only take over-the-counter or prescription medicines as directed by your caregiver. Over-the-counter medicines to reduce pain and inflammation are often the most helpful.Your caregiver may prescribe muscle relaxant drugs.These medicines help dull your pain so you can more quickly return to your normal activities and healthy exercise.  Put ice on the injured area.  Put ice in a plastic bag.  Place a towel between your skin and the bag.  Leave the ice on for 15-20 minutes, 03-04 times a day for the first 2 to 3 days. After that, ice and heat may be alternated to reduce pain and spasms.  Ask your caregiver about trying back exercises and gentle massage. This may be of some benefit.  Avoid feeling anxious or stressed.Stress increases muscle tension and can worsen back pain.It is important to recognize when you are anxious or stressed and learn ways to manage it.Exercise is a great option. SEEK MEDICAL CARE IF:  You have pain that is not relieved with rest or medicine.  You have pain that does not improve in 1 week.  You have new symptoms.  You are generally not feeling well. SEEK  IMMEDIATE MEDICAL CARE IF:   You have pain that radiates from your back into your legs.  You develop new bowel or bladder control problems.  You have unusual weakness or numbness in your arms or legs.  You develop nausea or vomiting.  You develop abdominal pain.  You feel faint. Document Released: 11/13/2005 Document Revised: 05/14/2012 Document Reviewed: 04/03/2011 [facility]ExitCare Patient Information 2014 [place]ExitCare, [place].  Bacterial Vaginosis Bacterial vaginosis is a vaginal infection that occurs when the normal balance of  bacteria in the vagina is disrupted. It results from an overgrowth of certain bacteria. This is the most common vaginal infection in women of childbearing age. Treatment is important to prevent complications, especially in pregnant women, as it can cause a premature delivery. CAUSES  Bacterial vaginosis is caused by an increase in harmful bacteria that are normally present in smaller amounts in the vagina. Several different kinds of bacteria can cause bacterial vaginosis. However, the reason that the condition develops is not fully understood. RISK FACTORS Certain activities or behaviors can put you at an increased risk of developing bacterial vaginosis, including:  Having a new sex partner or multiple sex partners.  Douching.  Using an intrauterine device (IUD) for contraception. Women do not get bacterial vaginosis from toilet seats, bedding, swimming pools, or contact with objects around them. SIGNS AND SYMPTOMS  Some women with bacterial vaginosis have no signs or symptoms. Common symptoms include:  Grey vaginal discharge.  A fishlike odor with discharge, especially after sexual intercourse.  Itching or burning of the vagina and vulva.  Burning or pain with urination. DIAGNOSIS  Your health care provider will take a medical history and examine the vagina for signs of bacterial vaginosis. A sample of vaginal fluid may be taken. Your health care provider will look at this sample under a microscope to check for bacteria and abnormal cells. A vaginal pH test may also be done.  TREATMENT  Bacterial vaginosis may be treated with antibiotic medicines. These may be given in the form of a pill or a vaginal cream. A second round of antibiotics may be prescribed if the condition comes back after treatment.  HOME CARE INSTRUCTIONS   Only take over-the-counter or prescription medicines as directed by your health care provider.  If antibiotic medicine was prescribed, take it as directed. Make  sure you finish it even if you start to feel better.  Do not have sex until treatment is completed.  Tell all sexual partners that you have a vaginal infection. They should see their health care provider and be treated if they have problems, such as a mild rash or itching.  Practice safe sex by using condoms and only having one sex partner. SEEK MEDICAL CARE IF:   Your symptoms are not improving after 3 days of treatment.  You have increased discharge or pain.  You have a fever. MAKE SURE YOU:   Understand these instructions.  Will watch your condition.  Will get help right away if you are not doing well or get worse. FOR MORE INFORMATION  Centers for Disease Control and Prevention, Division of STD Prevention: SolutionApps.co.zawww.cdc.gov/std American Sexual Health Association (ASHA): www.ashastd.org  Document Released: 11/13/2005 Document Revised: 09/03/2013 Document Reviewed: 06/25/2013 [facility]ExitCare Patient Information 2014 [place], [place]LL[place].

## 2014-01-21 NOTE — ED Provider Notes (Signed)
Medical screening examination/treatment/procedure(s) were performed by non-physician practitioner and as supervising physician I was immediately available for consultation/collaboration.  EKG Interpretation   None         Dagmar HaitWilliam Chigozie Basaldua, MD 01/21/14 1438

## 2014-01-22 LAB — GC/CHLAMYDIA PROBE AMP
CT Probe RNA: NEGATIVE
GC Probe RNA: NEGATIVE

## 2014-03-11 ENCOUNTER — Emergency Department (HOSPITAL_BASED_OUTPATIENT_CLINIC_OR_DEPARTMENT_OTHER)
Admission: EM | Admit: 2014-03-11 | Discharge: 2014-03-11 | Disposition: A | Discharge status: Home / Self Care | Payer: Medicaid Other | Attending: Emergency Medicine | Admitting: Emergency Medicine

## 2014-03-11 ENCOUNTER — Encounter (HOSPITAL_BASED_OUTPATIENT_CLINIC_OR_DEPARTMENT_OTHER): Payer: Self-pay | Admitting: Emergency Medicine

## 2014-03-11 DIAGNOSIS — Z8739 Personal history of other diseases of the musculoskeletal system and connective tissue: Secondary | ICD-10-CM | POA: Insufficient documentation

## 2014-03-11 DIAGNOSIS — Z8719 Personal history of other diseases of the digestive system: Secondary | ICD-10-CM | POA: Insufficient documentation

## 2014-03-11 DIAGNOSIS — N390 Urinary tract infection, site not specified: Secondary | ICD-10-CM | POA: Insufficient documentation

## 2014-03-11 DIAGNOSIS — Z3202 Encounter for pregnancy test, result negative: Secondary | ICD-10-CM | POA: Insufficient documentation

## 2014-03-11 DIAGNOSIS — F17200 Nicotine dependence, unspecified, uncomplicated: Secondary | ICD-10-CM | POA: Insufficient documentation

## 2014-03-11 DIAGNOSIS — Z792 Long term (current) use of antibiotics: Secondary | ICD-10-CM | POA: Insufficient documentation

## 2014-03-11 DIAGNOSIS — Z79899 Other long term (current) drug therapy: Secondary | ICD-10-CM | POA: Insufficient documentation

## 2014-03-11 HISTORY — DX: Irritable bowel syndrome, unspecified: K58.9

## 2014-03-11 HISTORY — DX: Urinary tract infection, site not specified: N39.0

## 2014-03-11 LAB — URINALYSIS, ROUTINE W REFLEX MICROSCOPIC
BILIRUBIN URINE: NEGATIVE
Glucose, UA: NEGATIVE mg/dL
Hgb urine dipstick: NEGATIVE
KETONES UR: NEGATIVE mg/dL
NITRITE: NEGATIVE
PROTEIN: NEGATIVE mg/dL
Specific Gravity, Urine: 1.005 (ref 1.005–1.030)
Urobilinogen, UA: 0.2 mg/dL (ref 0.0–1.0)
pH: 7 (ref 5.0–8.0)

## 2014-03-11 LAB — URINE MICROSCOPIC-ADD ON

## 2014-03-11 LAB — PREGNANCY, URINE: PREG TEST UR: NEGATIVE

## 2014-03-11 MED ORDER — CEPHALEXIN 500 MG PO CAPS: 500.0000 mg | ORAL_CAPSULE | Freq: Two times a day (BID) | ORAL | Status: DC

## 2014-03-11 MED ORDER — PHENAZOPYRIDINE HCL 200 MG PO TABS: 200.0000 mg | ORAL_TABLET | Freq: Three times a day (TID) | ORAL | Status: DC | PRN

## 2014-03-11 NOTE — Discharge Instructions (Signed)
We are prescribing Keflex (antibiotic) for your UTI as well as Pyridium to use as needed for pain.   You should STOP taking your mother's Toviaz.    Please follow-up with your new primary care doctor in the next couple of weeks.   Urinary Tract Infection A urinary tract infection (UTI) can occur any place along the urinary tract. The tract includes the kidneys, ureters, bladder, and urethra. A type of germ called bacteria often causes a UTI. UTIs are often helped with antibiotic medicine.  HOME CARE   If given, take antibiotics as told by your doctor. Finish them even if you start to feel better.  Drink enough fluids to keep your pee (urine) clear or pale yellow.  Avoid tea, drinks with caffeine, and bubbly (carbonated) drinks.  Pee often. Avoid holding your pee in for a long time.  Pee before and after having sex (intercourse).  Wipe from front to back after you poop (bowel movement) if you are a woman. Use each tissue only once. GET HELP RIGHT AWAY IF:   You have back pain.  You have lower belly (abdominal) pain.  You have chills.  You feel sick to your stomach (nauseous).  You throw up (vomit).  Your burning or discomfort with peeing does not go away.  You have a fever.  Your symptoms are not better in 3 days. MAKE SURE YOU:   Understand these instructions.  Will watch your condition.  Will get help right away if you are not doing well or get worse. Document Released: 05/01/2008 Document Revised: 08/07/2012 Document Reviewed: 06/13/2012 [facility]Exit[facility] Patient Information 2014 [place]Exit[place], [place]LLC.

## 2014-03-11 NOTE — ED Provider Notes (Signed)
Patient seen/examined in the Emergency Department in conjunction with Resident Physician Provider Rogers Patient reports dysuria Exam : awake/alert, no distress, resting comfortably Plan: treat for UTI Stable for d/c home BP 123/91  Pulse 77  Temp(Src) 98.4 F (36.9 C) (Oral)  Resp 18  Ht 5\' 9"  (1.753 m)  Wt 150 lb (68.04 kg)  BMI 22.14 kg/m2  SpO2 97%    Joya Gaskinsonald W Cariah Salatino, MD 03/11/14 [phone]

## 2014-03-11 NOTE — ED Provider Notes (Signed)
I have personally seen and examined the patient.  I have discussed the plan of care with the resident.  I have reviewed the documentation on PMH/FH/Soc. History.  I have reviewed the documentation of the resident and agree.   Joya Gaskinsonald W Kalissa Grays, MD 03/11/14 [phone]

## 2014-03-11 NOTE — ED Notes (Signed)
Urinary frequency, dysuria and abdominal cramps x 3 days.

## 2014-03-11 NOTE — ED Provider Notes (Signed)
CSN: [id number]     Arrival date & time 03/11/14  [id number] History   First MD Initiated Contact with Patient 03/11/14 [phone]     Chief Complaint  Patient presents with  . Dysuria     (Consider location/radiation/quality/duration/timing/severity/associated sxs/prior Treatment) HPI 33 year old woman who presents with dysuria, urgency, frequency x 3 days.  No fever, flank pain, emesis.  She has been taking OTC Azo and took one of her mother's Toviaz yesterday. Also had UTI at end of February, first in >10 years, treated with Bactrim.  No vaginal discharge, updated Mirena in place, in monogamous relationship and using protection.     Past Medical History  Diagnosis Date  . Scoliosis   . UTI (urinary tract infection)   . IBS (irritable bowel syndrome)    Past Surgical History  Procedure Laterality Date  . Spine surgery  2010    SCOLIOSIS SPINAL FUSION  . Hidranitis boil removal  2012   No family history on file. History  Substance Use Topics  . Smoking status: Current Every Day Smoker -- 1.00 packs/day    Types: Cigarettes  . Smokeless tobacco: Never Used  . Alcohol Use: No   OB History   Grav Para Term Preterm Abortions TAB SAB Ect Mult Living                 Review of Systems  Constitutional: Negative for fever.  Respiratory: Negative for shortness of breath.   Cardiovascular: Negative for chest pain.  Gastrointestinal: Negative for nausea, abdominal pain, diarrhea and constipation.  Genitourinary: Positive for dysuria, frequency, decreased urine volume and difficulty urinating. Negative for hematuria, flank pain and vaginal discharge.  Neurological: Negative for dizziness, syncope and weakness.    Allergies  Review of patient's allergies indicates no known allergies.  Home Medications   Prior to Admission medications   Medication Sig Start Date End Date Taking? Authorizing Provider  ALPRAZolam Prudy Feeler(XANAX) 1 MG tablet Take 1 mg by mouth 2 (two) times daily.    Historical  Provider, MD  HYDROcodone-acetaminophen (LORTAB) 7.5-500 MG/15ML solution Take 15 mLs by mouth every 6 (six) hours as needed for pain. 11/26/12   April K Palumbo-Rasch, MD  HYDROcodone-acetaminophen (NORCO) 5-325 MG per tablet Take 2 tablets by mouth every 4 (four) hours as needed. 01/18/14   Geoffery Lyonsouglas Delo, MD  ibuprofen (ADVIL,MOTRIN) 600 MG tablet Take 1 tablet (600 mg total) by mouth every 6 (six) hours as needed for pain. 02/09/13   Ethelda ChickMartha K Linker, MD  methocarbamol (ROBAXIN) 500 MG tablet Take 1 tablet (500 mg total) by mouth 2 (two) times daily. 11/26/12   April K Palumbo-Rasch, MD  metroNIDAZOLE (FLAGYL) 500 MG tablet Take 1 tablet (500 mg total) by mouth 2 (two) times daily. 01/21/14   Teressa LowerVrinda Pickering, NP  ondansetron (ZOFRAN) 4 MG tablet Take 1 tablet (4 mg total) by mouth every 6 (six) hours. 02/09/13   Ethelda ChickMartha K Linker, MD  oxyCODONE (OXYCONTIN) 15 MG TB12 Take 15 mg by mouth every 12 (twelve) hours.      Historical Provider, MD  oxyCODONE-acetaminophen (PERCOCET/ROXICET) 5-325 MG per tablet Take 1-2 tablets by mouth every 4 (four) hours as needed for severe pain. 01/21/14   Teressa LowerVrinda Pickering, NP  phenazopyridine (PYRIDIUM) 200 MG tablet Take 1 tablet (200 mg total) by mouth 3 (three) times daily as needed for pain. 01/18/14   Geoffery Lyonsouglas Delo, MD   BP 123/91  Pulse 77  Temp(Src) 98.4 F (36.9 C) (Oral)  Resp 18  Ht  5\' 9"  (1.753 m)  Wt 150 lb (68.04 kg)  BMI 22.14 kg/m2  SpO2 97% Physical Exam  Constitutional: She is oriented to person, place, and time. She appears well-developed and well-nourished. No distress.  HENT:  Head: Normocephalic and atraumatic.  Eyes: Pupils are equal, round, and reactive to light.  Neck: Normal range of motion. Neck supple.  Cardiovascular: Normal rate and regular rhythm.   Pulmonary/Chest: Effort normal and breath sounds normal. No respiratory distress.  Abdominal: Soft. Bowel sounds are normal. There is tenderness.  Mild suprapubic TTP  Musculoskeletal:  Normal range of motion.  Neurological: She is alert and oriented to person, place, and time.  Skin: Skin is warm and dry.    ED Course  Procedures (including critical care time) Labs Review Labs Reviewed  URINALYSIS, ROUTINE W REFLEX MICROSCOPIC - Abnormal; Notable for the following:    Leukocytes, UA SMALL (*)    All other components within normal limits  URINE MICROSCOPIC-ADD ON - Abnormal; Notable for the following:    Squamous Epithelial / LPF FEW (*)    Bacteria, UA FEW (*)    All other components within normal limits  URINE CULTURE  PREGNANCY, URINE    Imaging Review No results found.   EKG Interpretation None      MDM   Final diagnoses:  UTI (lower urinary tract infection)  UTI- Dysuria, urgency, frequency. VSS, afebrile. UA showed small leucocytes, 11-20 WBC, few bacteria.  Will send urine culture as well.  Urine pregnancy negative.   Discharge with prescriptions for Keflex, Pyridium.  Instructed patient to take return if symptoms worsen, follow-up with PCP.     Rocco SereneMorgan Gennie Dib, MD 03/11/14 [phone]

## 2014-03-12 LAB — URINE CULTURE: Colony Count: 8000

## 2014-11-04 ENCOUNTER — Ambulatory Visit
Admission: RE | Admit: 2014-11-04 | Discharge: 2014-11-04 | Disposition: A | Discharge status: Home / Self Care | Payer: Medicaid Other | Source: Ambulatory Visit | Attending: Family Medicine | Admitting: Family Medicine

## 2014-11-04 ENCOUNTER — Other Ambulatory Visit: Payer: Self-pay | Admitting: Family Medicine

## 2014-11-04 DIAGNOSIS — R1084 Generalized abdominal pain: Secondary | ICD-10-CM

## 2014-12-03 ENCOUNTER — Ambulatory Visit (INDEPENDENT_AMBULATORY_CARE_PROVIDER_SITE_OTHER): Payer: Self-pay | Admitting: Surgery

## 2014-12-03 NOTE — Progress Notes (Signed)
Please put orders in Epic surgery 12-10-14 pre op 12-09-14 Thanks

## 2014-12-07 NOTE — Patient Instructions (Signed)
Julie Pratt  12/07/2014   Your procedure is scheduled on: 12/10/2014    [facility] and follow signs to               Short Stay Center at       100pm  Call this number if you have problems the morning of surgery [phone]   Remember:  Do not eat food after midnite.  May have clear liquids until 0800am then nothing by mouth.       Take these medicines the morning of surgery with A SIP OF WATER:                                You may not have any metal on your body including hair pins and              piercings  Do not wear jewelry, make-up, lotions, powders or perfumes.             Do not wear nail polish.  Do not shave  48 hours prior to surgery.               Do not bring valuables to the hospital. [facility] IS NOT             RESPONSIBLE   FOR VALUABLES.  Contacts, dentures or bridgework may not be worn into surgery.       Patients discharged the day of surgery will not be allowed to drive home.  Name and phone number of your driver:  Special Instructions: coughing and deep breathing exercises, leg exercises               Please read over the following fact sheets you were given: _____________________________________________________________________             [facility] Before surgery, you can play an important role.  Because skin is not sterile, your skin needs to be as free of germs as possible.  You can reduce the number of germs on your skin by washing with CHG (chlorahexidine gluconate) soap before surgery.  CHG is an antiseptic cleaner which kills germs and bonds with the skin to continue killing germs even after washing. Please DO NOT use if you have an allergy to CHG or antibacterial soaps.  If your skin becomes reddened/irritated stop using the CHG and inform your nurse when you arrive at Short Stay. Do not shave (including legs and underarms) for at least 48 hours prior to the  first CHG shower.  You may shave your face/neck. Please follow these instructions carefully:  1.  Shower with CHG Soap the night before surgery and the  morning of Surgery.  2.  If you choose to wash your hair, wash your hair first as usual with your  normal  shampoo.  3.  After you shampoo, rinse your hair and body thoroughly to remove the  shampoo.                           4.  Use CHG as you would any other liquid soap.  You can apply chg directly  to the skin and wash  Gently with a scrungie or clean washcloth.  5.  Apply the CHG Soap to your body ONLY FROM THE NECK DOWN.   Do not use on face/ open                           Wound or open sores. Avoid contact with eyes, ears mouth and genitals (private parts).                       Wash face,  Genitals (private parts) with your normal soap.             6.  Wash thoroughly, paying special attention to the area where your surgery  will be performed.  7.  Thoroughly rinse your body with warm water from the neck down.  8.  DO NOT shower/wash with your normal soap after using and rinsing off  the CHG Soap.                9.  Pat yourself dry with a clean towel.            10.  Wear clean pajamas.            11.  Place clean sheets on your bed the night of your first shower and do not  sleep with pets. Day of Surgery : Do not apply any lotions/deodorants the morning of surgery.  Please wear clean clothes to the hospital/surgery center.  FAILURE TO FOLLOW THESE INSTRUCTIONS MAY RESULT IN THE CANCELLATION OF YOUR SURGERY PATIENT SIGNATURE_________________________________  NURSE SIGNATURE__________________________________  ________________________________________________________________________

## 2014-12-09 ENCOUNTER — Inpatient Hospital Stay (HOSPITAL_COMMUNITY)
Admission: RE | Admit: 2014-12-09 | Discharge: 2014-12-09 | Disposition: A | Discharge status: Home / Self Care | Payer: Medicaid Other | Source: Ambulatory Visit

## 2014-12-09 ENCOUNTER — Encounter (HOSPITAL_COMMUNITY): Payer: Self-pay | Admitting: *Deleted

## 2014-12-10 ENCOUNTER — Ambulatory Visit (HOSPITAL_COMMUNITY): Payer: Medicaid Other | Admitting: Certified Registered Nurse Anesthetist

## 2014-12-10 ENCOUNTER — Ambulatory Visit (HOSPITAL_COMMUNITY)
Admission: RE | Admit: 2014-12-10 | Discharge: 2014-12-10 | Disposition: A | Discharge status: Home / Self Care | Payer: Medicaid Other | Source: Ambulatory Visit | Attending: Surgery | Admitting: Surgery

## 2014-12-10 ENCOUNTER — Ambulatory Visit (HOSPITAL_COMMUNITY): Payer: Medicaid Other

## 2014-12-10 ENCOUNTER — Encounter (HOSPITAL_COMMUNITY): Payer: Self-pay | Admitting: Certified Registered Nurse Anesthetist

## 2014-12-10 ENCOUNTER — Encounter (HOSPITAL_COMMUNITY)
Admission: RE | Disposition: A | Discharge status: Home / Self Care | Payer: Self-pay | Source: Ambulatory Visit | Attending: Surgery

## 2014-12-10 DIAGNOSIS — K259 Gastric ulcer, unspecified as acute or chronic, without hemorrhage or perforation: Secondary | ICD-10-CM | POA: Diagnosis not present

## 2014-12-10 DIAGNOSIS — K8010 Calculus of gallbladder with chronic cholecystitis without obstruction: Secondary | ICD-10-CM | POA: Insufficient documentation

## 2014-12-10 DIAGNOSIS — K8020 Calculus of gallbladder without cholecystitis without obstruction: Secondary | ICD-10-CM

## 2014-12-10 DIAGNOSIS — F419 Anxiety disorder, unspecified: Secondary | ICD-10-CM | POA: Insufficient documentation

## 2014-12-10 DIAGNOSIS — F17210 Nicotine dependence, cigarettes, uncomplicated: Secondary | ICD-10-CM | POA: Diagnosis not present

## 2014-12-10 DIAGNOSIS — N926 Irregular menstruation, unspecified: Secondary | ICD-10-CM | POA: Insufficient documentation

## 2014-12-10 HISTORY — PX: CHOLECYSTECTOMY: SHX55

## 2014-12-10 LAB — CBC
HCT: 43.9 % (ref 36.0–46.0)
HEMOGLOBIN: 14.7 g/dL (ref 12.0–15.0)
MCH: 31.5 pg (ref 26.0–34.0)
MCHC: 33.5 g/dL (ref 30.0–36.0)
MCV: 94 fL (ref 78.0–100.0)
Platelets: 323 10*3/uL (ref 150–400)
RBC: 4.67 MIL/uL (ref 3.87–5.11)
RDW: 13.7 % (ref 11.5–15.5)
WBC: 5.1 10*3/uL (ref 4.0–10.5)

## 2014-12-10 LAB — HCG, SERUM, QUALITATIVE: PREG SERUM: NEGATIVE

## 2014-12-10 SURGERY — LAPAROSCOPIC CHOLECYSTECTOMY WITH INTRAOPERATIVE CHOLANGIOGRAM
Anesthesia: General | Site: Abdomen

## 2014-12-10 MED ORDER — MIDAZOLAM HCL 2 MG/2ML IJ SOLN
INTRAMUSCULAR | Status: AC
  Filled 2014-12-10: qty 2

## 2014-12-10 MED ORDER — OXYCODONE-ACETAMINOPHEN 5-325 MG PO TABS: 1.0000 | ORAL_TABLET | ORAL | Status: AC | PRN

## 2014-12-10 MED ORDER — 0.9 % SODIUM CHLORIDE (POUR BTL) OPTIME
TOPICAL | Status: DC | PRN
  Administered 2014-12-10: 1000 mL

## 2014-12-10 MED ORDER — ACETAMINOPHEN 10 MG/ML IV SOLN
1000.0000 mg | Freq: Once | INTRAVENOUS | Status: AC
  Administered 2014-12-10: 1000 mg via INTRAVENOUS
  Filled 2014-12-10: qty 100

## 2014-12-10 MED ORDER — FENTANYL CITRATE 0.05 MG/ML IJ SOLN
INTRAMUSCULAR | Status: DC | PRN
  Administered 2014-12-10: 100 ug via INTRAVENOUS
  Administered 2014-12-10 (×3): 50 ug via INTRAVENOUS

## 2014-12-10 MED ORDER — OXYCODONE-ACETAMINOPHEN 5-325 MG PO TABS
ORAL_TABLET | ORAL | Status: AC
  Administered 2014-12-10: 1
  Filled 2014-12-10: qty 1

## 2014-12-10 MED ORDER — LIDOCAINE HCL (CARDIAC) 20 MG/ML IV SOLN
INTRAVENOUS | Status: AC
  Filled 2014-12-10: qty 5

## 2014-12-10 MED ORDER — SUCCINYLCHOLINE CHLORIDE 20 MG/ML IJ SOLN
INTRAMUSCULAR | Status: DC | PRN
  Administered 2014-12-10: 100 mg via INTRAVENOUS

## 2014-12-10 MED ORDER — PROPOFOL 10 MG/ML IV BOLUS
INTRAVENOUS | Status: DC | PRN
  Administered 2014-12-10: 150 mg via INTRAVENOUS

## 2014-12-10 MED ORDER — HYDROMORPHONE HCL 1 MG/ML IJ SOLN
INTRAMUSCULAR | Status: AC
  Filled 2014-12-10: qty 1

## 2014-12-10 MED ORDER — HYDROMORPHONE HCL 1 MG/ML IJ SOLN
0.2500 mg | INTRAMUSCULAR | Status: DC | PRN
  Administered 2014-12-10 (×2): 0.5 mg via INTRAVENOUS

## 2014-12-10 MED ORDER — SODIUM CHLORIDE 0.9 % IV SOLN
INTRAVENOUS | Status: DC | PRN
  Administered 2014-12-10: 8 mL

## 2014-12-10 MED ORDER — NEOSTIGMINE METHYLSULFATE 10 MG/10ML IV SOLN
INTRAVENOUS | Status: DC | PRN
  Administered 2014-12-10: 5 mg via INTRAVENOUS

## 2014-12-10 MED ORDER — ONDANSETRON HCL 4 MG/2ML IJ SOLN
INTRAMUSCULAR | Status: AC
  Filled 2014-12-10: qty 2

## 2014-12-10 MED ORDER — LACTATED RINGERS IR SOLN
Status: DC | PRN
  Administered 2014-12-10: 1000 mL

## 2014-12-10 MED ORDER — DEXAMETHASONE SODIUM PHOSPHATE 10 MG/ML IJ SOLN
INTRAMUSCULAR | Status: AC
  Filled 2014-12-10: qty 1

## 2014-12-10 MED ORDER — FENTANYL CITRATE 0.05 MG/ML IJ SOLN
INTRAMUSCULAR | Status: AC
  Filled 2014-12-10: qty 2

## 2014-12-10 MED ORDER — FENTANYL CITRATE 0.05 MG/ML IJ SOLN
50.0000 ug | INTRAMUSCULAR | Status: DC | PRN
  Administered 2014-12-10 (×2): 50 ug via INTRAVENOUS
  Administered 2014-12-10: 25 ug via INTRAVENOUS

## 2014-12-10 MED ORDER — CHLORHEXIDINE GLUCONATE 4 % EX LIQD: 1.0000 "application " | Freq: Once | CUTANEOUS | Status: DC

## 2014-12-10 MED ORDER — DEXAMETHASONE SODIUM PHOSPHATE 10 MG/ML IJ SOLN
INTRAMUSCULAR | Status: DC | PRN
  Administered 2014-12-10: 10 mg via INTRAVENOUS

## 2014-12-10 MED ORDER — LIDOCAINE HCL (CARDIAC) 20 MG/ML IV SOLN
INTRAVENOUS | Status: DC | PRN
  Administered 2014-12-10: 50 mg via INTRAVENOUS

## 2014-12-10 MED ORDER — PROPOFOL 10 MG/ML IV BOLUS
INTRAVENOUS | Status: AC
  Filled 2014-12-10: qty 20

## 2014-12-10 MED ORDER — MIDAZOLAM BOLUS VIA INFUSION
1.0000 mg | INTRAVENOUS | Status: DC | PRN
  Administered 2014-12-10: 0.5 mg via INTRAVENOUS

## 2014-12-10 MED ORDER — MORPHINE SULFATE 10 MG/ML IJ SOLN: 2.0000 mg | INTRAMUSCULAR | Status: DC | PRN

## 2014-12-10 MED ORDER — LACTATED RINGERS IV SOLN
INTRAVENOUS | Status: DC
  Administered 2014-12-10 (×2): via INTRAVENOUS
  Administered 2014-12-10: 1000 mL via INTRAVENOUS

## 2014-12-10 MED ORDER — BUPIVACAINE-EPINEPHRINE 0.25% -1:200000 IJ SOLN
INTRAMUSCULAR | Status: DC | PRN
  Administered 2014-12-10: 15 mL

## 2014-12-10 MED ORDER — KETOROLAC TROMETHAMINE 15 MG/ML IJ SOLN
15.0000 mg | Freq: Once | INTRAMUSCULAR | Status: AC
  Administered 2014-12-10: 15 mg via INTRAVENOUS

## 2014-12-10 MED ORDER — FENTANYL CITRATE 0.05 MG/ML IJ SOLN
INTRAMUSCULAR | Status: AC
  Filled 2014-12-10: qty 5

## 2014-12-10 MED ORDER — ROCURONIUM BROMIDE 100 MG/10ML IV SOLN
INTRAVENOUS | Status: DC | PRN
  Administered 2014-12-10: 5 mg via INTRAVENOUS
  Administered 2014-12-10: 20 mg via INTRAVENOUS

## 2014-12-10 MED ORDER — BUPIVACAINE-EPINEPHRINE (PF) 0.25% -1:200000 IJ SOLN
INTRAMUSCULAR | Status: AC
  Filled 2014-12-10: qty 30

## 2014-12-10 MED ORDER — KETOROLAC TROMETHAMINE 15 MG/ML IJ SOLN
INTRAMUSCULAR | Status: AC
  Filled 2014-12-10: qty 1

## 2014-12-10 MED ORDER — NEOSTIGMINE METHYLSULFATE 10 MG/10ML IV SOLN
INTRAVENOUS | Status: AC
  Filled 2014-12-10: qty 1

## 2014-12-10 MED ORDER — CEFAZOLIN SODIUM-DEXTROSE 2-3 GM-% IV SOLR
INTRAVENOUS | Status: AC
  Filled 2014-12-10: qty 50

## 2014-12-10 MED ORDER — ROCURONIUM BROMIDE 100 MG/10ML IV SOLN
INTRAVENOUS | Status: AC
  Filled 2014-12-10: qty 1

## 2014-12-10 MED ORDER — GLYCOPYRROLATE 0.2 MG/ML IJ SOLN
INTRAMUSCULAR | Status: DC | PRN
  Administered 2014-12-10: 0.2 mg via INTRAVENOUS
  Administered 2014-12-10: 0.6 mg via INTRAVENOUS

## 2014-12-10 MED ORDER — MIDAZOLAM HCL 5 MG/5ML IJ SOLN
INTRAMUSCULAR | Status: DC | PRN
  Administered 2014-12-10: 2 mg via INTRAVENOUS

## 2014-12-10 MED ORDER — ONDANSETRON HCL 4 MG/2ML IJ SOLN
INTRAMUSCULAR | Status: DC | PRN
  Administered 2014-12-10: 4 mg via INTRAVENOUS

## 2014-12-10 MED ORDER — OXYCODONE-ACETAMINOPHEN 5-325 MG PO TABS
1.0000 | ORAL_TABLET | ORAL | Status: DC | PRN
  Administered 2014-12-10: 1 via ORAL

## 2014-12-10 MED ORDER — ONDANSETRON HCL 4 MG/2ML IJ SOLN: 4.0000 mg | INTRAMUSCULAR | Status: DC | PRN

## 2014-12-10 MED ORDER — FENTANYL CITRATE 0.05 MG/ML IJ SOLN: 25.0000 ug | INTRAMUSCULAR | Status: DC | PRN

## 2014-12-10 MED ORDER — CEFAZOLIN SODIUM-DEXTROSE 2-3 GM-% IV SOLR
2.0000 g | INTRAVENOUS | Status: AC
  Administered 2014-12-10: 2 g via INTRAVENOUS

## 2014-12-10 MED ORDER — MIDAZOLAM HCL 2 MG/2ML IJ SOLN
0.5000 mg | Freq: Once | INTRAMUSCULAR | Status: AC
  Administered 2014-12-10: 0.5 mg via INTRAVENOUS

## 2014-12-10 MED ORDER — LACTATED RINGERS IV SOLN
INTRAVENOUS | Status: DC
Start: 2014-12-10 — End: 2014-12-10

## 2014-12-10 MED ORDER — GLYCOPYRROLATE 0.2 MG/ML IJ SOLN
INTRAMUSCULAR | Status: AC
  Filled 2014-12-10: qty 4

## 2014-12-10 SURGICAL SUPPLY — 45 items
APPLIER CLIP ROT 10 11.4 M/L (STAPLE) ×3
BENZOIN TINCTURE PRP APPL 2/3 (GAUZE/BANDAGES/DRESSINGS) ×3 IMPLANT
CHLORAPREP W/TINT 26ML (MISCELLANEOUS) ×3 IMPLANT
CLIP APPLIE ROT 10 11.4 M/L (STAPLE) ×1 IMPLANT
CLOSURE WOUND 1/2 X4 (GAUZE/BANDAGES/DRESSINGS) ×1
COVER MAYO STAND STRL (DRAPES) ×3 IMPLANT
DECANTER SPIKE VIAL GLASS SM (MISCELLANEOUS) IMPLANT
DRAPE C-ARM 42X120 X-RAY (DRAPES) ×3 IMPLANT
DRAPE LAPAROSCOPIC ABDOMINAL (DRAPES) ×3 IMPLANT
DRAPE UTILITY XL STRL (DRAPES) ×3 IMPLANT
DRSG TEGADERM 2-3/8X2-3/4 SM (GAUZE/BANDAGES/DRESSINGS) ×3 IMPLANT
DRSG TEGADERM 4X4.75 (GAUZE/BANDAGES/DRESSINGS) ×3 IMPLANT
ELECT REM PT RETURN 9FT ADLT (ELECTROSURGICAL) ×3
ELECTRODE REM PT RTRN 9FT ADLT (ELECTROSURGICAL) ×1 IMPLANT
FILTER SMOKE EVAC LAPAROSHD (FILTER) IMPLANT
GAUZE SPONGE 2X2 8PLY STRL LF (GAUZE/BANDAGES/DRESSINGS) ×1 IMPLANT
GLOVE BIO SURGEON STRL SZ 6.5 (GLOVE) ×2 IMPLANT
GLOVE BIO SURGEON STRL SZ7 (GLOVE) ×3 IMPLANT
GLOVE BIO SURGEON STRL SZ7.5 (GLOVE) ×3 IMPLANT
GLOVE BIO SURGEONS STRL SZ 6.5 (GLOVE) ×1
GLOVE BIOGEL PI IND STRL 7.0 (GLOVE) ×1 IMPLANT
GLOVE BIOGEL PI IND STRL 7.5 (GLOVE) ×3 IMPLANT
GLOVE BIOGEL PI INDICATOR 7.0 (GLOVE) ×2
GLOVE BIOGEL PI INDICATOR 7.5 (GLOVE) ×6
GLOVE SS BIOGEL STRL SZ 7 (GLOVE) ×1 IMPLANT
GLOVE SUPERSENSE BIOGEL SZ 7 (GLOVE) ×2
GOWN STRL REUS W/TWL LRG LVL3 (GOWN DISPOSABLE) ×6 IMPLANT
GOWN STRL REUS W/TWL XL LVL3 (GOWN DISPOSABLE) ×6 IMPLANT
IV LACTATED RINGERS 1000ML (IV SOLUTION) ×3 IMPLANT
KIT BASIN OR (CUSTOM PROCEDURE TRAY) ×3 IMPLANT
NS IRRIG 1000ML POUR BTL (IV SOLUTION) ×3 IMPLANT
POUCH SPECIMEN RETRIEVAL 10MM (ENDOMECHANICALS) ×3 IMPLANT
RINGERS IRRIG 1000ML POUR BTL (IV SOLUTION) IMPLANT
SET CHOLANGIOGRAPH MIX (MISCELLANEOUS) ×3 IMPLANT
SET IRRIG TUBING LAPAROSCOPIC (IRRIGATION / IRRIGATOR) ×3 IMPLANT
SPONGE GAUZE 2X2 STER 10/PKG (GAUZE/BANDAGES/DRESSINGS) ×2
STRIP CLOSURE SKIN 1/2X4 (GAUZE/BANDAGES/DRESSINGS) ×2 IMPLANT
SUT MNCRL AB 4-0 PS2 18 (SUTURE) ×3 IMPLANT
SYR 20CC LL (SYRINGE) ×3 IMPLANT
TOWEL OR 17X26 10 PK STRL BLUE (TOWEL DISPOSABLE) ×3 IMPLANT
TOWEL OR NON WOVEN STRL DISP B (DISPOSABLE) ×3 IMPLANT
TRAY LAPAROSCOPIC (CUSTOM PROCEDURE TRAY) ×3 IMPLANT
TROCAR BLADELESS OPT 5 75 (ENDOMECHANICALS) ×3 IMPLANT
TROCAR XCEL BLUNT TIP 100MML (ENDOMECHANICALS) ×3 IMPLANT
TROCAR XCEL NON-BLD 11X100MML (ENDOMECHANICALS) ×3 IMPLANT

## 2014-12-10 NOTE — Anesthesia Procedure Notes (Signed)
Procedure Name: Intubation Date/Time: 12/10/2014 3:38 PM Performed by: Carolyne FiscalINMAN, [name] F Pre-anesthesia Checklist: Patient identified, Emergency Drugs available, Suction available, Patient being monitored and Timeout performed Patient Re-evaluated:Patient Re-evaluated prior to inductionOxygen Delivery Method: Circle system utilized Preoxygenation: Pre-oxygenation with 100% oxygen Intubation Type: IV induction Ventilation: Mask ventilation without difficulty Laryngoscope Size: Miller and 2 Grade View: Grade I Tube type: Oral Tube size: 7.0 mm Number of attempts: 1 Airway Equipment and Method: Stylet Placement Confirmation: ETT inserted through vocal cords under direct vision and positive ETCO2 Secured at: 21 cm Tube secured with: Tape Dental Injury: Teeth and Oropharynx as per pre-operative assessment

## 2014-12-10 NOTE — Anesthesia Preprocedure Evaluation (Addendum)
Anesthesia Evaluation  Patient identified by MRN, date of birth, ID band Patient awake    Reviewed: Allergy & Precautions, H&P , NPO status , Patient's Chart, lab work & pertinent test results  Airway Mallampati: II  TM Distance: >3 FB Neck ROM: full    Dental no notable dental hx. (+) Teeth Intact, Dental Advisory Given   Pulmonary Current Smoker,  breath sounds clear to auscultation  Pulmonary exam normal       Cardiovascular Exercise Tolerance: Good negative cardio ROS  Rhythm:regular Rate:Normal     Neuro/Psych negative neurological ROS  negative psych ROS   GI/Hepatic negative GI ROS, Neg liver ROS,   Endo/Other  negative endocrine ROS  Renal/GU negative Renal ROS  negative genitourinary   Musculoskeletal   Abdominal   Peds  Hematology negative hematology ROS (+)   Anesthesia Other Findings   Reproductive/Obstetrics negative OB ROS                            Anesthesia Physical Anesthesia Plan  ASA: II  Anesthesia Plan: General   Post-op Pain Management:    Induction: Intravenous  Airway Management Planned: Oral ETT  Additional Equipment:   Intra-op Plan:   Post-operative Plan: Extubation in OR  Informed Consent: I have reviewed the patients History and Physical, chart, labs and discussed the procedure including the risks, benefits and alternatives for the proposed anesthesia with the patient or authorized representative who has indicated his/her understanding and acceptance.   Dental Advisory Given  Plan Discussed with: CRNA and Surgeon  Anesthesia Plan Comments:         Anesthesia Quick Evaluation

## 2014-12-10 NOTE — Anesthesia Postprocedure Evaluation (Signed)
  Anesthesia Post-op Note  Patient: Julie Pratt  Procedure(s) Performed: Procedure(s) (LRB): LAPAROSCOPIC CHOLECYSTECTOMY WITH INTRAOPERATIVE CHOLANGIOGRAM (N/A)  Patient Location: PACU  Anesthesia Type: General  Level of Consciousness: awake and alert   Airway and Oxygen Therapy: Patient Spontanous Breathing  Post-op Pain: mild  Post-op Assessment: Post-op Vital signs reviewed, Patient's Cardiovascular Status Stable, Respiratory Function Stable, Patent Airway and No signs of Nausea or vomiting  Last Vitals:  Filed Vitals:   12/10/14 1815  BP:   Pulse: 54  Temp:   Resp:     Post-op Vital Signs: stable   Complications: No apparent anesthesia complications

## 2014-12-10 NOTE — Op Note (Signed)
Laparoscopic Cholecystectomy with IOC Procedure Note  Indications: This patient presents with symptomatic gallbladder disease and will undergo laparoscopic cholecystectomy.  Pre-operative Diagnosis: Calculus of gallbladder with other cholecystitis, without mention of obstruction  Post-operative Diagnosis: Same  Surgeon: Alex Mcmanigal K.   Assistants: None  Anesthesia: General endotracheal anesthesia  ASA Class: 1  Procedure Details  The patient was seen again in the Holding Room. The risks, benefits, complications, treatment options, and expected outcomes were discussed with the patient. The possibilities of reaction to medication, pulmonary aspiration, perforation of viscus, bleeding, recurrent infection, finding a normal gallbladder, the need for additional procedures, failure to diagnose a condition, the possible need to convert to an open procedure, and creating a complication requiring transfusion or operation were discussed with the patient. The likelihood of improving the patient's symptoms with return to their baseline status is good.  The patient and/or family concurred with the proposed plan, giving informed consent. The site of surgery properly noted. The patient was taken to Operating Room, identified as Julie Pratt and the procedure verified as Laparoscopic Cholecystectomy with Intraoperative Cholangiogram. A Time Out was held and the above information confirmed.  Prior to the induction of general anesthesia, antibiotic prophylaxis was administered. General endotracheal anesthesia was then administered and tolerated well. After the induction, the abdomen was prepped with Chloraprep and draped in the sterile fashion. The patient was positioned in the supine position.  Local anesthetic agent was injected into the skin near the umbilicus and an incision made. We dissected down to the abdominal fascia with blunt dissection.  The fascia was incised vertically and we entered  the peritoneal cavity bluntly.  A pursestring suture of 0-Vicryl was placed around the fascial opening.  The Hasson cannula was inserted and secured with the stay suture.  Pneumoperitoneum was then created with CO2 and tolerated well without any adverse changes in the patient's vital signs. An 11-mm port was placed in the subxiphoid position.  Two 5-mm ports were placed in the right upper quadrant. All skin incisions were infiltrated with a local anesthetic agent before making the incision and placing the trocars.   We positioned the patient in reverse Trendelenburg, tilted slightly to the patient's left.  The gallbladder was identified, the fundus grasped and retracted cephalad. She has a very large gallbladder with significant omental adhesions.  Adhesions were lysed bluntly and with the electrocautery where indicated, taking care not to injure any adjacent organs or viscus. The infundibulum was grasped and retracted laterally, exposing the peritoneum overlying the triangle of Calot. This was then divided and exposed in a blunt fashion. A critical view of the cystic duct and cystic artery was obtained.  The cystic duct was clearly identified and bluntly dissected circumferentially. The cystic duct was ligated with a clip distally.   An incision was made in the cystic duct and the [facility] cholangiogram catheter introduced. The catheter was secured using a clip. A cholangiogram was then obtained which showed good visualization of the distal and proximal biliary tree with no sign of filling defects or obstruction.  The cholangiogram was impaired by her Harrington Rods from spinal surgery.  I could not clearly see the cystic duct/ common duct junction, but I could see the entire common bile duct.  Contrast flowed easily into the duodenum. The catheter was then removed.   The cystic duct was then ligated with clips and divided. The cystic artery was identified, dissected free, ligated with clips and divided as well.    The gallbladder was dissected  from the liver bed in retrograde fashion with the electrocautery. The gallbladder was removed and placed in an Endocatch sac. The liver bed was irrigated and inspected. Hemostasis was achieved with the electrocautery. Copious irrigation was utilized and was repeatedly aspirated until clear.  The gallbladder and Endocatch sac were then removed through the umbilical port site.  The pursestring suture was used to close the umbilical fascia.    We again inspected the right upper quadrant for hemostasis.  Pneumoperitoneum was released as we removed the trocars.  4-0 Monocryl was used to close the skin.   Benzoin, steri-strips, and clean dressings were applied. The patient was then extubated and brought to the recovery room in stable condition. Instrument, sponge, and needle counts were correct at closure and at the conclusion of the case.   Findings: Cholecystitis with Cholelithiasis  Estimated Blood Loss: Minimal         Drains: none         Specimens: Gallbladder           Complications: None; patient tolerated the procedure well.         Disposition: PACU - hemodynamically stable.         Condition: stable   Wilmon Arms. Corliss Skains, MD, [facility]  General/ Trauma Surgery  12/10/2014 4:42 PM

## 2014-12-10 NOTE — Transfer of Care (Signed)
Immediate Anesthesia Transfer of Care Note  Patient: Julie Pratt  Procedure(s) Performed: Procedure(s) (LRB): LAPAROSCOPIC CHOLECYSTECTOMY WITH INTRAOPERATIVE CHOLANGIOGRAM (N/A)  Patient Location: PACU  Anesthesia Type: General  Level of Consciousness: sedated, patient cooperative and responds to stimulation  Airway & Oxygen Therapy: Patient Spontanous Breathing and Patient connected to face mask oxgen  Post-op Assessment: Report given to PACU RN and Post -op Vital signs reviewed and stable  Post vital signs: Reviewed and stable  Complications: No apparent anesthesia complications

## 2014-12-10 NOTE — Discharge Instructions (Signed)
[facility] °LAPAROSCOPIC SURGERY: POST OP INSTRUCTIONS °Always review your discharge instruction sheet given to you by the facility where your surgery was performed. °IF YOU HAVE DISABILITY OR FAMILY LEAVE FORMS, YOU MUST BRING THEM TO THE OFFICE FOR PROCESSING.   °DO NOT GIVE THEM TO YOUR DOCTOR. ° °1. A prescription for pain medication will be given to you upon discharge.  Take your pain medication as prescribed, if needed.  If narcotic pain medicine is not needed, then you may take acetaminophen (Tylenol) or ibuprofen (Advil) as needed. °2. Take your usually prescribed medications unless otherwise directed. °3. If you need a refill on your pain medication, please contact your pharmacy.  They will contact our office to request authorization. Prescriptions will not be filled after 5pm or on week-ends. °4. You should follow a light diet the first few days after arrival home, such as soup and crackers, etc.  Be sure to include lots of fluids daily. °5. Most patients will experience some swelling and bruising in the area of the incisions.  Ice packs will help.  Swelling and bruising can take several days to resolve.  °6. It is common to experience some constipation if taking pain medication after surgery.  Increasing fluid intake and taking a stool softener (such as Colace) will usually help or prevent this problem from occurring.  A mild laxative (Milk of Magnesia or Miralax) should be taken according to package instructions if there are no bowel movements after 48 hours. °7. Unless discharge instructions indicate otherwise, you may remove your bandages 48 hours after surgery, and you may shower at that time.  You will have steri-strips (small skin tapes) in place directly over the incision.  These strips should be left on the skin for 7-10 days.  If your surgeon used skin glue on the incision, you may shower in 24 hours.  The glue will flake off over the next 2-3 weeks.  Any sutures or staples  will be removed at the office during your follow-up visit. °8. ACTIVITIES:  You may resume regular (light) daily activities beginning the next day--such as daily self-care, walking, climbing stairs--gradually increasing activities as tolerated.  You may have sexual intercourse when it is comfortable.  Refrain from any heavy lifting or straining until approved by your doctor. °a. You may drive when you are no longer taking prescription pain medication, you can comfortably wear a seatbelt, and you can safely maneuver your car and apply brakes. °b. RETURN TO WORK:   2-3 weeks °9. You should see your doctor in the office for a follow-up appointment approximately 2-3 weeks after your surgery.  Make sure that you call for this appointment within a day or two after you arrive home to insure a convenient appointment time. °10. OTHER INSTRUCTIONS: ________________________________________________________________________ °WHEN TO CALL YOUR DOCTOR: °1. Fever over 101.0 °2. Inability to urinate °3. Continued bleeding from incision. °4. Increased pain, redness, or drainage from the incision. °5. Increasing abdominal pain ° °The clinic staff is available to answer your questions during regular business hours.  Please don’t hesitate to call and ask to speak to one of the nurses for clinical concerns.  If you have a medical emergency, go to the nearest emergency room or call 911.  A surgeon from [facility] is always on call at the hospital. °[address], [address], [place], [place]  [address] ? [address] [address], [place], [place]   [address] °[phone] ? [phone] ? FAX [phone] °Web site:   [email or web address] ° °

## 2014-12-10 NOTE — H&P (Signed)
History of Present Illness Julie Pratt. Cadyn Fann MD; 11/26/2014 11:39 AM) Patient words: eval gallstones.  The patient is a 34 year old female who presents for evaluation of gall stones. Referred by Dr. Benedetto Goad for gallbladder disease   This is a 34 year old female in good health who presents with a one-month history of epigastric and right upper quadrant abdominal pain that radiates through to her back. The patient awoke from sleep about a month ago with severe epigastric pain radiating straight to her back. She has chronic back pain from scoliosis and previous surgery and this pain felt much different. The pain radiated around her right side. This attack lasted about 6 hours. Since that time she has had a continued baseline of some mild right upper quadrant and epigastric abdominal pain that seems to be exacerbated by eating. She denies any jaundice, dark-colored urine, or abnormal appearing bowel movements. She had an ultrasound that showed gallstones. She has not had any blood work. She is now referred for surgical evaluation.    CLINICAL DATA: Single episode of epigastric abdominal pain approximately 1 week ago which lasted approximately 9 hr. EXAM: ULTRASOUND ABDOMEN COMPLETE COMPARISON: None. FINDINGS: Gallbladder: Numerous small gallstones, the largest on the order of 4-5 mm. Borderline gallbladder wall thickening at 3 mm. No pericholecystic fluid. Negative sonographic Murphy sign according to the ultrasound technologist. Common bile duct: Diameter: Approximately 4 mm. Liver: Normal size and echotexture without focal parenchymal abnormality. Patent portal vein with hepatopetal flow. IVC: Patent. Pancreas: Normal size and echotexture without focal parenchymal abnormality. Spleen: Normal size and echotexture without focal parenchymal abnormality. Right Kidney: Length: Approximately 11.4 cm. No hydronephrosis. Well-preserved cortex. No shadowing calculi. Normal  parenchymal echotexture. No focal parenchymal abnormality. Left Kidney: Length: Approximately 11.5 cm. No hydronephrosis. Well-preserved cortex. No shadowing calculi. Normal parenchymal echotexture. No focal parenchymal abnormality. Abdominal aorta: Normal in caliber throughout its visualized course in the abdomen without evidence of significant atherosclerosis. Maximum diameter 1.9 cm. Other findings: None. IMPRESSION: 1. Cholelithiasis without sonographic evidence of acute cholecystitis. 2. Otherwise normal examination. Electronically Signed By: Hulan Saas M.D. On: 11/04/2014 18:07    Other Problems [name], CMA; 11/26/2014 10:09 AM) Anxiety Disorder Back Pain Cholelithiasis Gastric Ulcer  Past Surgical History [name], CMA; 11/26/2014 10:09 AM) Spinal Surgery Midback  Diagnostic Studies History [name], CMA; 11/26/2014 10:09 AM) Colonoscopy never Mammogram never Pap Smear 1-5 years ago  Allergies [name], CMA; 11/26/2014 10:10 AM) No Known Drug Allergies12/31/2015  Medication History [name], CMA; 11/26/2014 10:11 AM) ALPRAZolam (  Tablet, Oral) Active. OxyCODONE HCl (  Tablet, Oral) Active.  Social History [name], CMA; 11/26/2014 10:09 AM) Caffeine use Carbonated beverages, Coffee, Tea. No alcohol use No drug use Tobacco use Current every day smoker.  Family History [name], CMA; 11/26/2014 10:09 AM) Arthritis Father. Diabetes Mellitus Father. Migraine Headache Brother, Mother.  Pregnancy / Birth History [name], CMA; 11/26/2014 10:09 AM) Age at menarche 13 years. Contraceptive History Intrauterine device. Gravida 1 Irregular periods Maternal age 54-30 Para 1  Review of Systems [name] CMA; 11/26/2014 10:09 AM) General Not Present- Appetite Loss, Chills, Fatigue, Fever, Night Sweats, Weight Gain and Weight Loss. Skin Not Present- Change  in Wart/Mole, Dryness, Hives, Jaundice, New Lesions, Non-Healing Wounds, Rash and Ulcer. HEENT Present- Seasonal Allergies and Wears glasses/contact lenses. Not Present- Earache, Hearing Loss, Hoarseness, Nose Bleed, Oral Ulcers, Ringing in the Ears, Sinus Pain, Sore Throat, Visual Disturbances and Yellow Eyes. Respiratory Not Present- Bloody sputum, Chronic Cough, Difficulty Breathing, Snoring  and Wheezing. Breast Not Present- Breast Mass, Breast Pain, Nipple Discharge and Skin Changes. Cardiovascular Not Present- Chest Pain, Difficulty Breathing Lying Down, Leg Cramps, Palpitations, Rapid Heart Rate, Shortness of Breath and Swelling of Extremities. Gastrointestinal Present- Abdominal Pain, Bloating, Constipation and Indigestion. Not Present- Bloody Stool, Change in Bowel Habits, Chronic diarrhea, Difficulty Swallowing, Excessive gas, Gets full quickly at meals, Hemorrhoids, Nausea, Rectal Pain and Vomiting. Female Genitourinary Not Present- Frequency, Nocturia, Painful Urination, Pelvic Pain and Urgency. Musculoskeletal Present- Back Pain, Joint Stiffness and Muscle Pain. Not Present- Joint Pain, Muscle Weakness and Swelling of Extremities. Neurological Not Present- Decreased Memory, Fainting, Headaches, Numbness, Seizures, Tingling, Tremor, Trouble walking and Weakness. Psychiatric Not Present- Anxiety, Bipolar, Change in Sleep Pattern, Depression, Fearful and Frequent crying. Endocrine Not Present- Cold Intolerance, Excessive Hunger, Hair Changes, Heat Intolerance, Hot flashes and New Diabetes. Hematology Not Present- Easy Bruising, Excessive bleeding, Gland problems, HIV and Persistent Infections.   Vitals [name](Michele Daniels CMA; 11/26/2014 10:11 AM) 11/26/2014 10:11 AM Weight: 152.5 lb Height: 69in Body Surface Area: 1.84 m Body Mass Index: 22.52 kg/m Temp.: 98.18F  Pulse: 61 (Regular)  BP: 130/60 (Sitting, Left Arm, Standard)    Physical Exam Molli Hazard(Ronit Marczak K. Rachelle Edwards MD; 11/26/2014  11:39 AM) The physical exam findings are as follows: Note:WDWN in NAD HEENT: EOMI, sclera anicteric Neck: No masses, no thyromegaly Lungs: CTA bilaterally; normal respiratory effort CV: Regular rate and rhythm; no murmurs Abd: +bowel sounds, soft, mildly distended; tender in RUQ/ epigastrium; no palpable masses Ext: Well-perfused; no edema Skin: Warm, dry; no sign of jaundice    Assessment & Plan Molli Hazard(Damario Gillie K. 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 The surgical procedure has been discussed with the patient. Potential risks, benefits, alternative treatments, and expected outcomes have been explained. All of the patient's questions at this time have been answered. The likelihood of reaching the patient's treatment goal is good. The patient understand the proposed surgical procedure and wishes to proceed.   Julie ArmsMatthew K. Corliss Skainssuei, MD, [facility]  General/ Trauma Surgery  12/10/2014 7:16 AM

## 2014-12-11 ENCOUNTER — Encounter (HOSPITAL_COMMUNITY): Payer: Self-pay | Admitting: Surgery

## 2015-03-03 IMAGING — CT CT HEAD W/O CM
1 series · 16 of 30 positions shown, 20 images · non-contrast
Comparison: None.

CLINICAL DATA: The patient fell, striking back of the head.
Dizziness and loss of consciousness.

CT HEAD WITHOUT CONTRAST
TECHNIQUE: Contiguous axial images were obtained from the base of
the skull through the vertex without contrast.

[Series 2: head 4.8 h37s · axial · 0.44mm/px · z∈[-185,-33]mm · 16 of 36 slices shown, 20 images]
[im 2/36  brain]
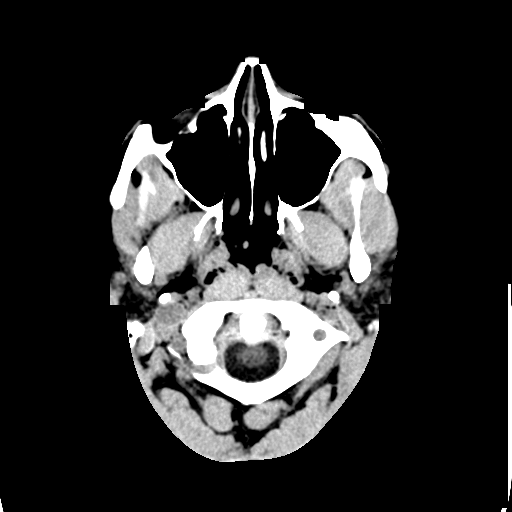
[im 2/36  bone]
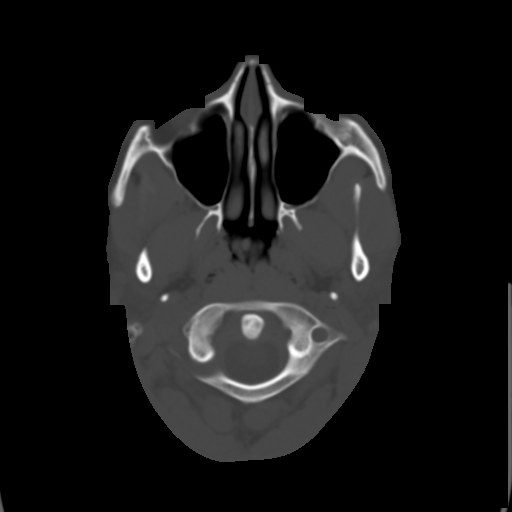
[im 4/36  brain]
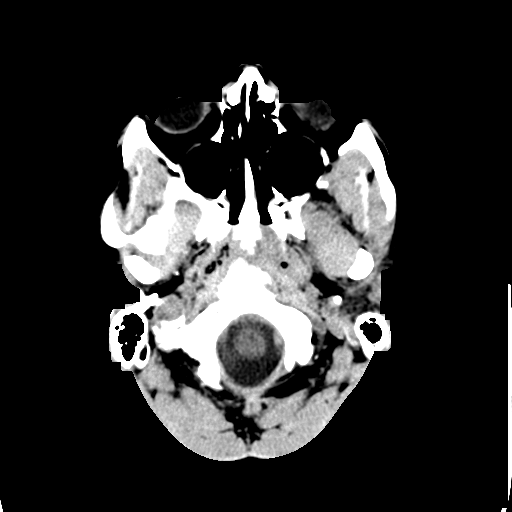
[im 7/36  brain]
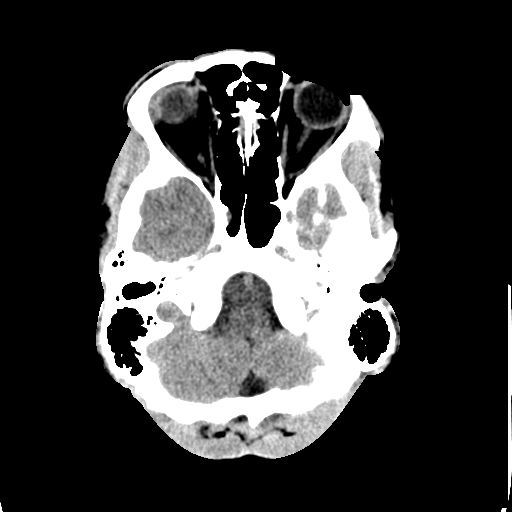
[im 9/36  brain]
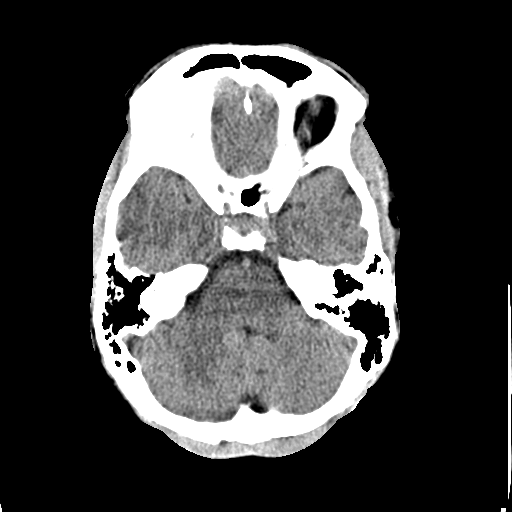
[im 10/36  brain]
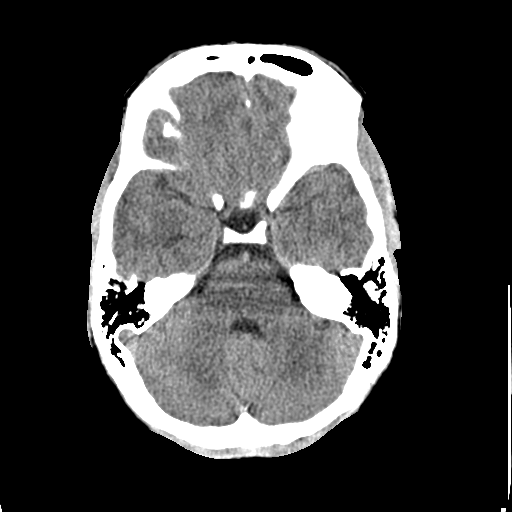
[im 10/36  bone]
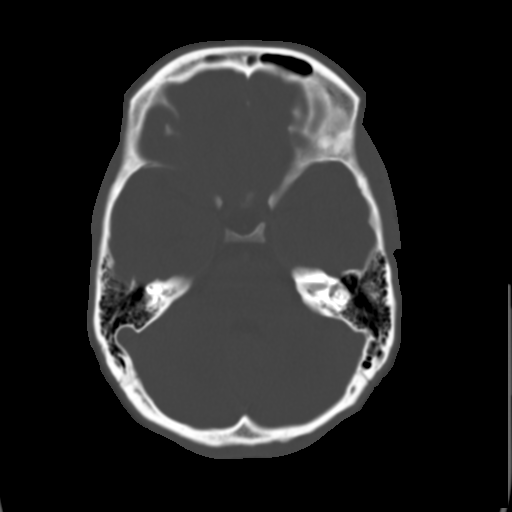
[im 13/36  brain]
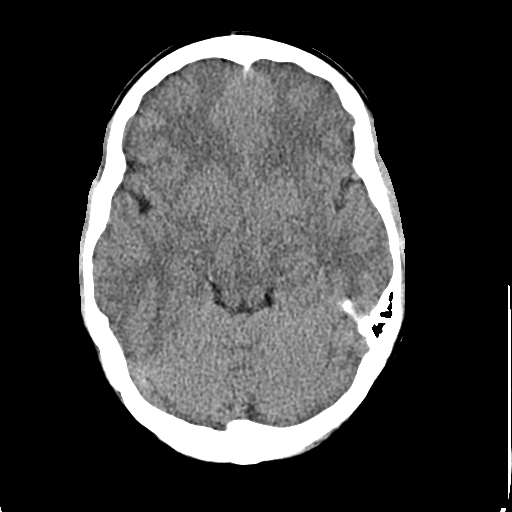
[im 15/36  brain]
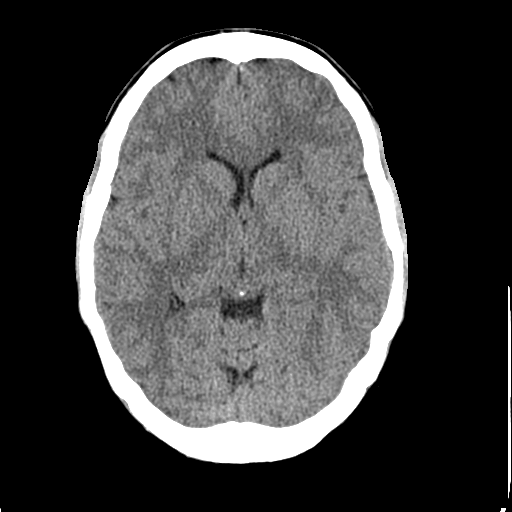
[im 17/36  brain]
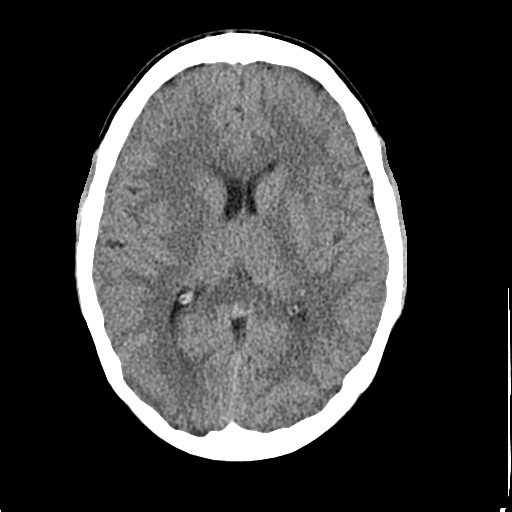
[im 19/36  brain]
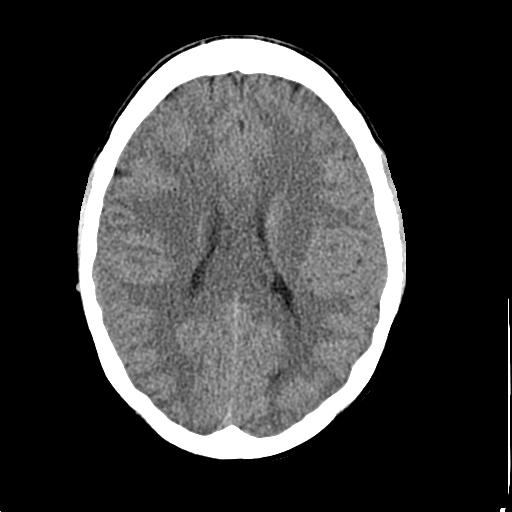
[im 19/36  bone]
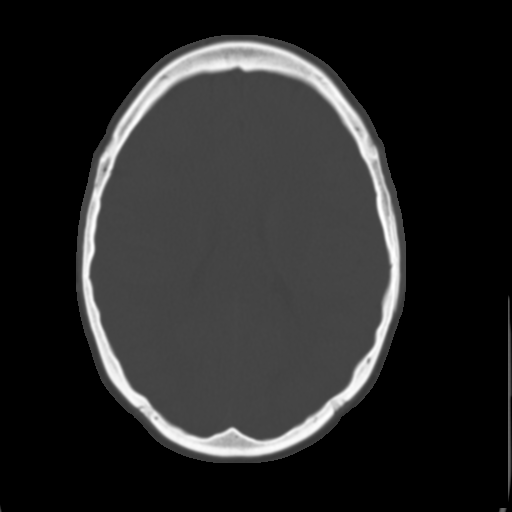
[im 21/36  brain]
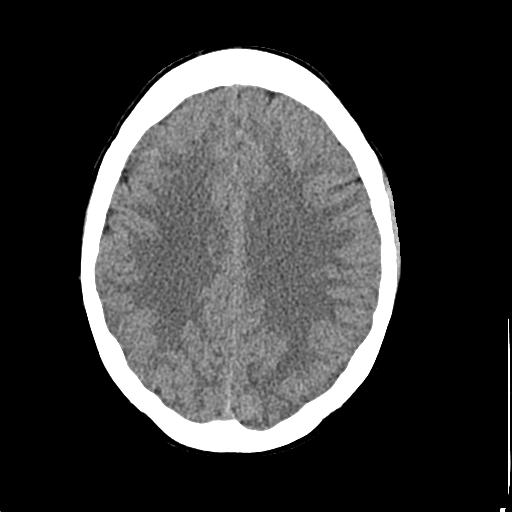
[im 23/36  brain]
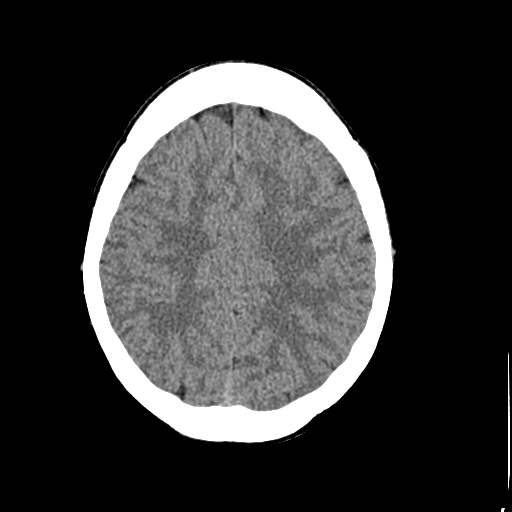
[im 26/36  brain]
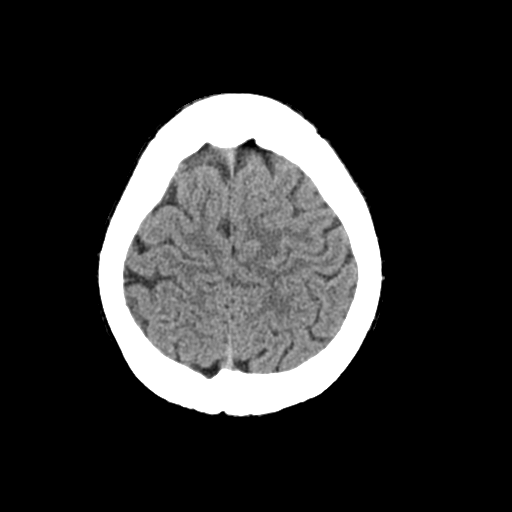
[im 27/36  brain]
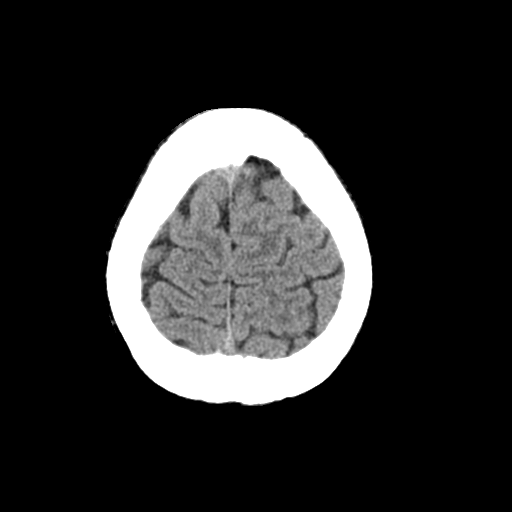
[im 27/36  bone]
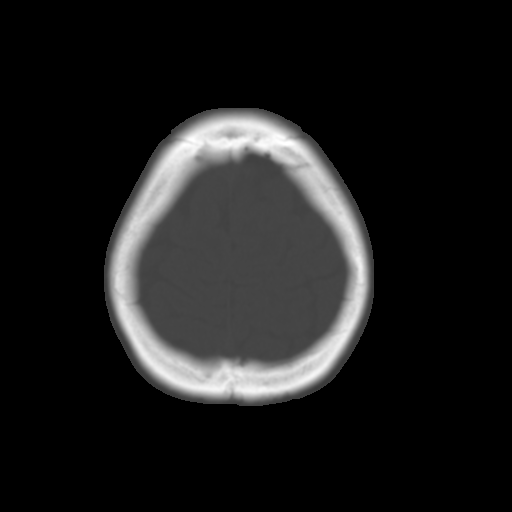
[im 29/36  brain]
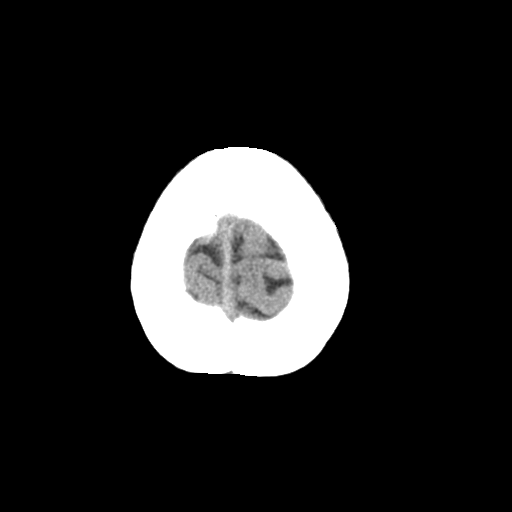
[im 32/36  brain]
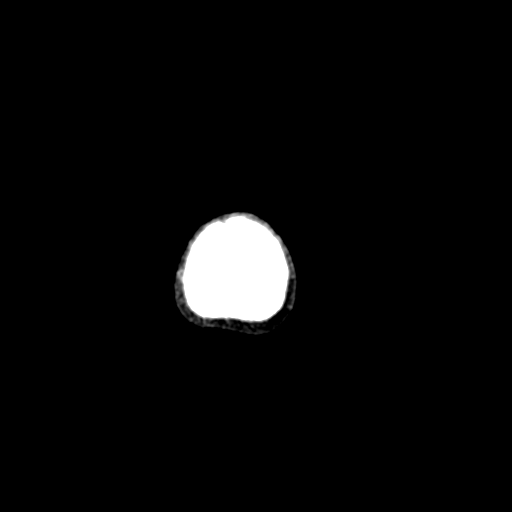
[im 34/36  brain]
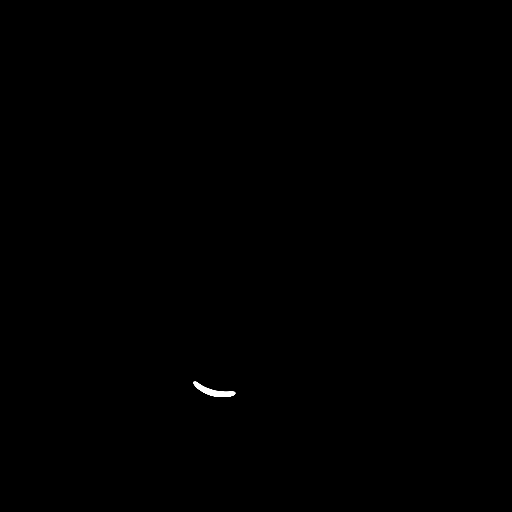

[16 of 30 positions shown; findings below may reference images not displayed]

FINDINGS: The ventricles and sulci are symmetrical without
significant effacement, displacement, or dilatation. No mass effect
or midline shift. No abnormal extra-axial fluid collections. The
grey-white matter junction is distinct. Basal cisterns are not
effaced. No acute intracranial hemorrhage. No depressed skull
fractures.  Visualized mastoid air cells and paranasal sinuses are
not opacified.
IMPRESSION: No acute intracranial abnormalities.

## 2016-11-25 IMAGING — US US ABDOMEN COMPLETE
1 series · 13 of 25 positions shown · non-contrast
Comparison: None.

CLINICAL DATA: Single episode of epigastric abdominal pain
approximately 1 week ago which lasted approximately 9 hr.

EXAM:
ULTRASOUND ABDOMEN COMPLETE

[Series 1: us abdomen complete · 0.19mm/px · 13 of 79 slices shown]
[im 1/79]
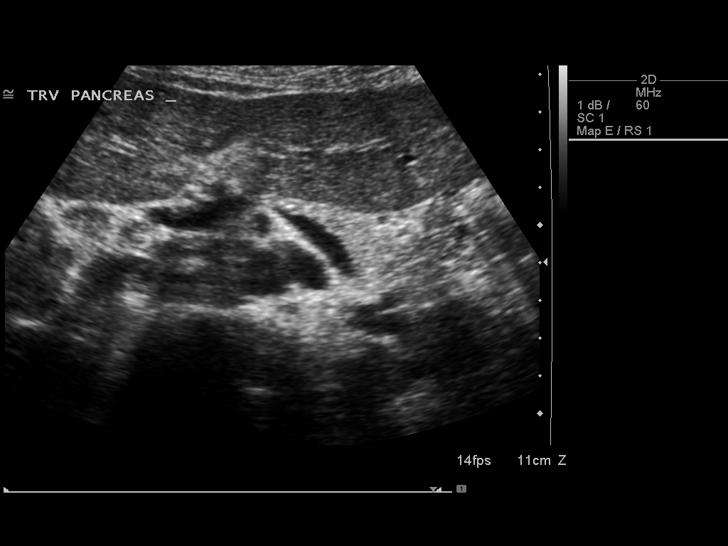
[im 7/79]
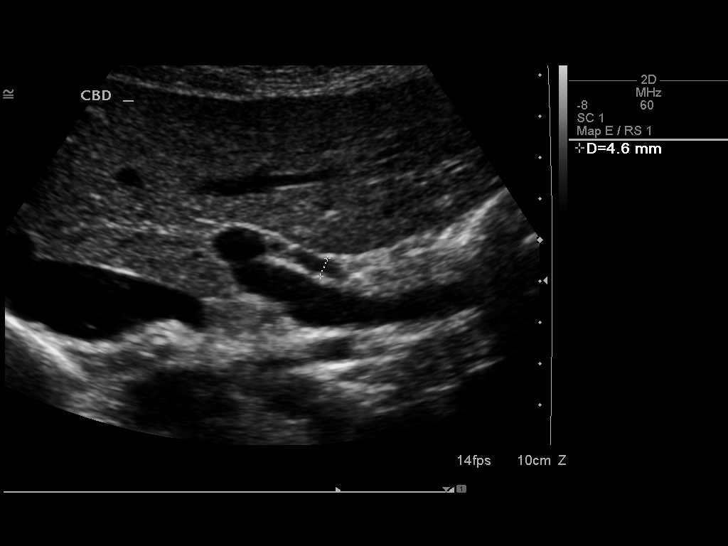
[im 14/79]
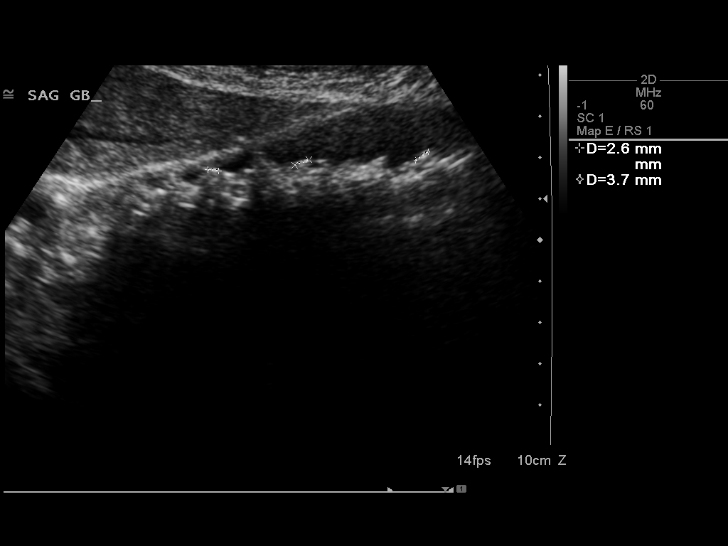
[im 20/79]
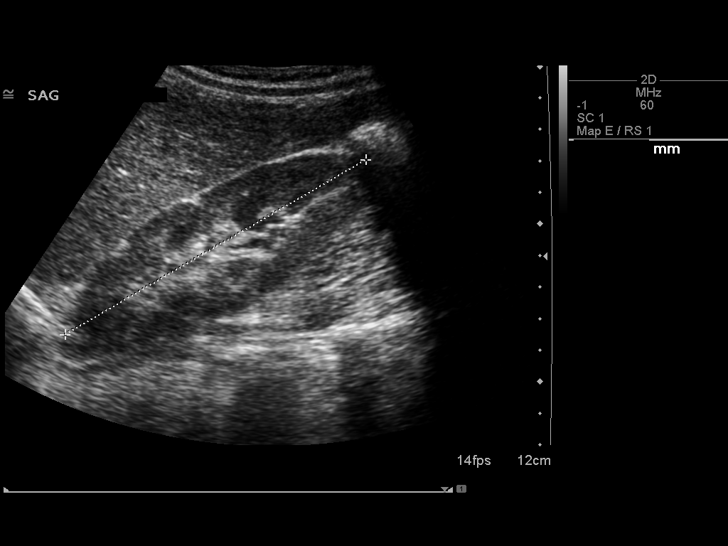
[im 27/79]
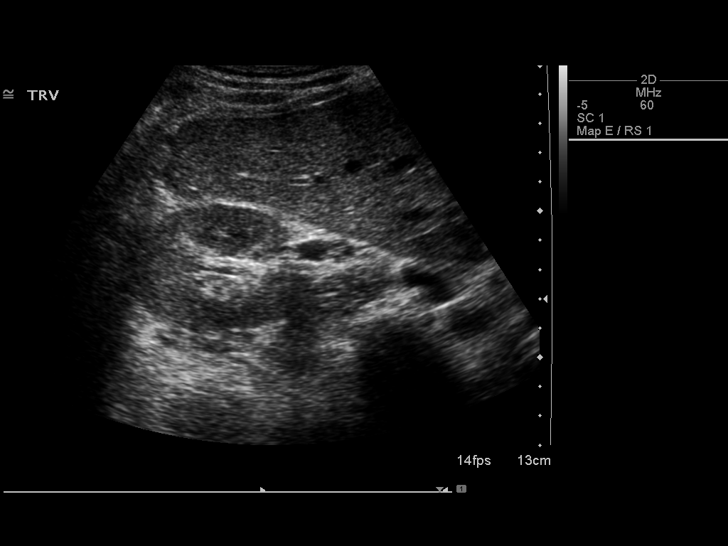
[im 33/79]
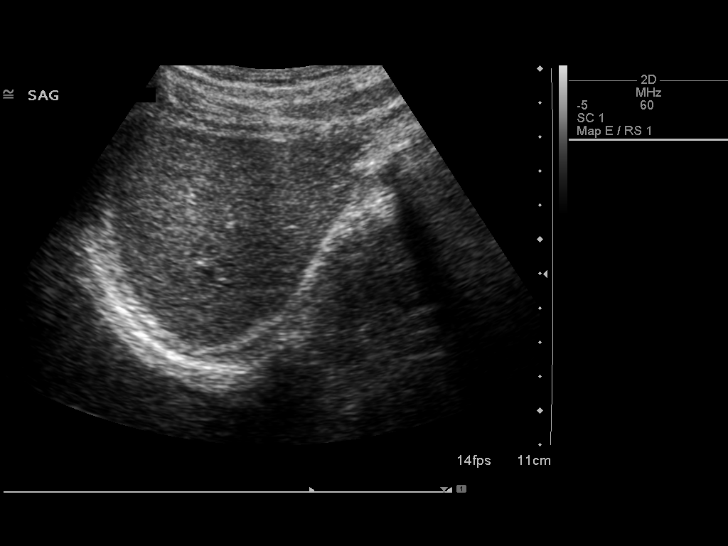
[im 40/79]
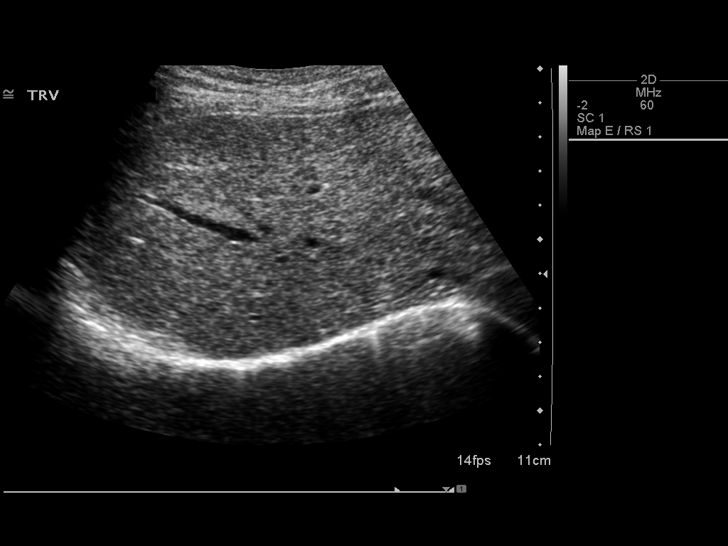
[im 46/79]
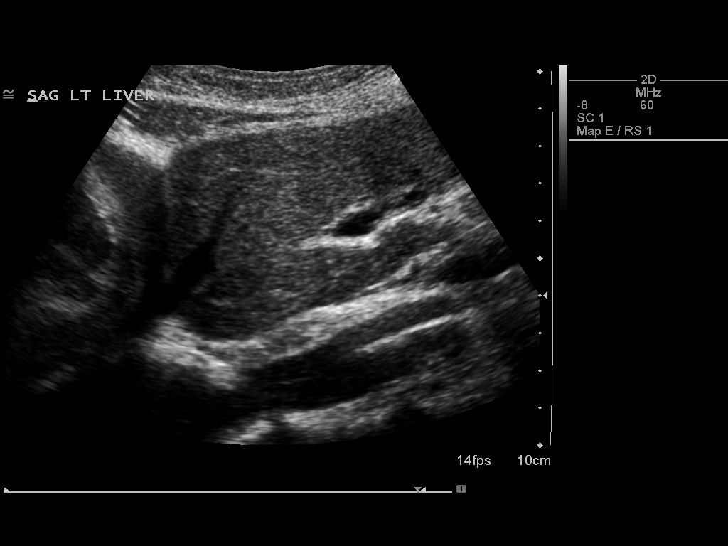
[im 53/79]
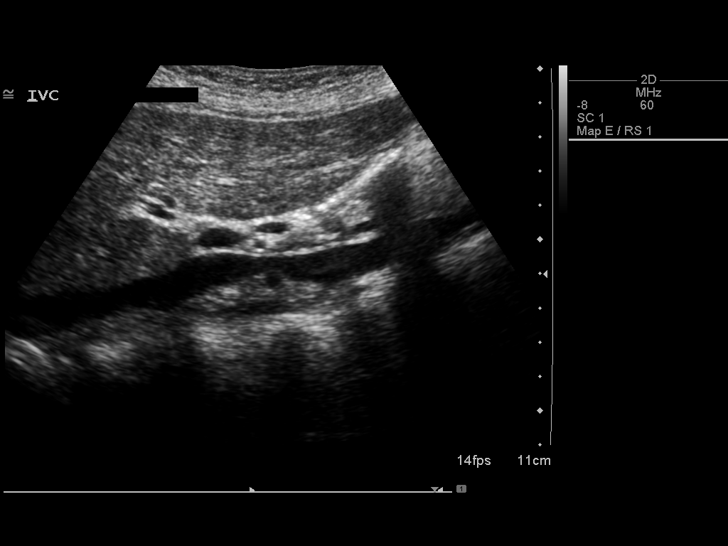
[im 59/79]
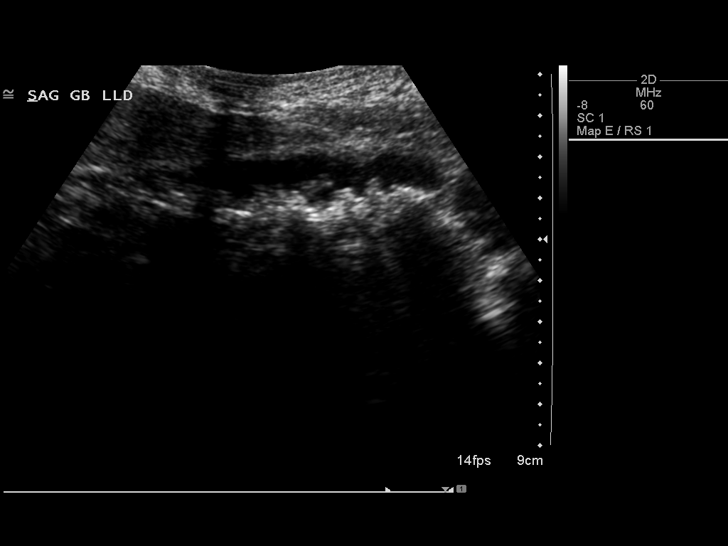
[im 66/79]
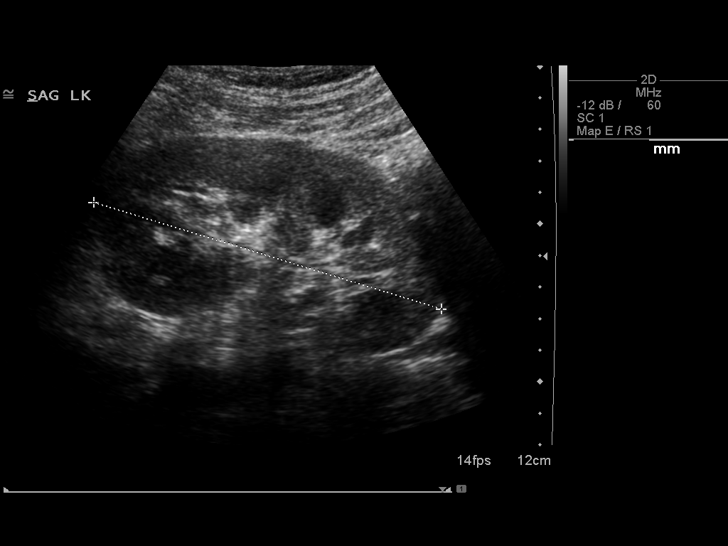
[im 72/79]
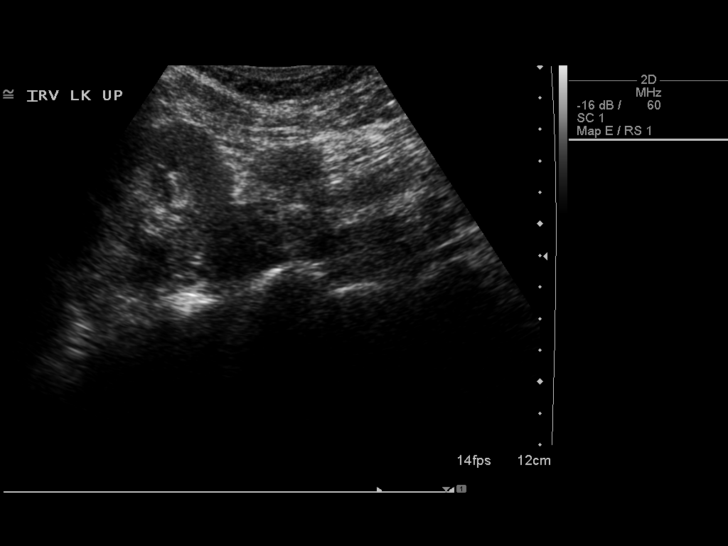
[im 79/79]
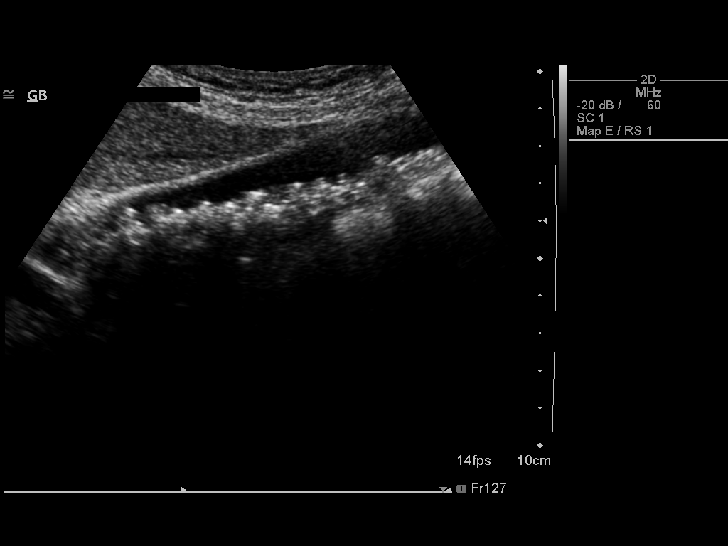

[13 of 25 positions shown; findings below may reference images not displayed]

FINDINGS: Gallbladder: Numerous small gallstones, the largest on the order of
4-5 mm. Borderline gallbladder wall thickening at 3 mm. No
pericholecystic fluid. Negative sonographic Murphy sign according to
the ultrasound technologist.

Common bile duct: Diameter: Approximately 4 mm.

Liver: Normal size and echotexture without focal parenchymal
abnormality. Patent portal vein with hepatopetal flow.

IVC: Patent.

Pancreas: Normal size and echotexture without focal parenchymal
abnormality.

Spleen: Normal size and echotexture without focal parenchymal
abnormality.

Right Kidney: Length: Approximately 11.4 cm. No hydronephrosis.
Well-preserved cortex. No shadowing calculi. Normal parenchymal
echotexture. No focal parenchymal abnormality.

Left Kidney: Length: Approximately 11.5 cm. No hydronephrosis.
Well-preserved cortex. No shadowing calculi. Normal parenchymal
echotexture. No focal parenchymal abnormality.

Abdominal aorta: Normal in caliber throughout its visualized course
in the abdomen without evidence of significant atherosclerosis.
Maximum diameter 1.9 cm.

Other findings: None.
IMPRESSION: 1. Cholelithiasis without sonographic evidence of acute
cholecystitis.
2. Otherwise normal examination.

## 2016-12-31 IMAGING — RF DG CHOLANGIOGRAM OPERATIVE
1 series · 8 of 8 positions shown · non-contrast
Comparison: None.

CLINICAL DATA: Gallstone

EXAM:
INTRAOPERATIVE CHOLANGIOGRAM
TECHNIQUE: Cholangiographic images from the C-arm fluoroscopic device were
submitted for interpretation post-operatively. Please see the
procedural report for the amount of contrast and the fluoroscopy
time utilized.

[Series 1: run · 2 acquisitions, 8 frames shown]
[im 1/2]
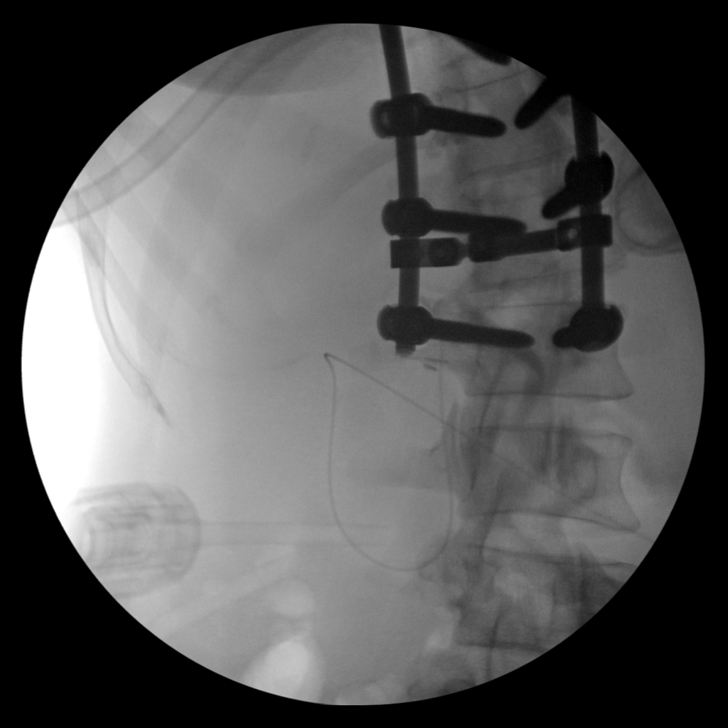
[im 1/2]
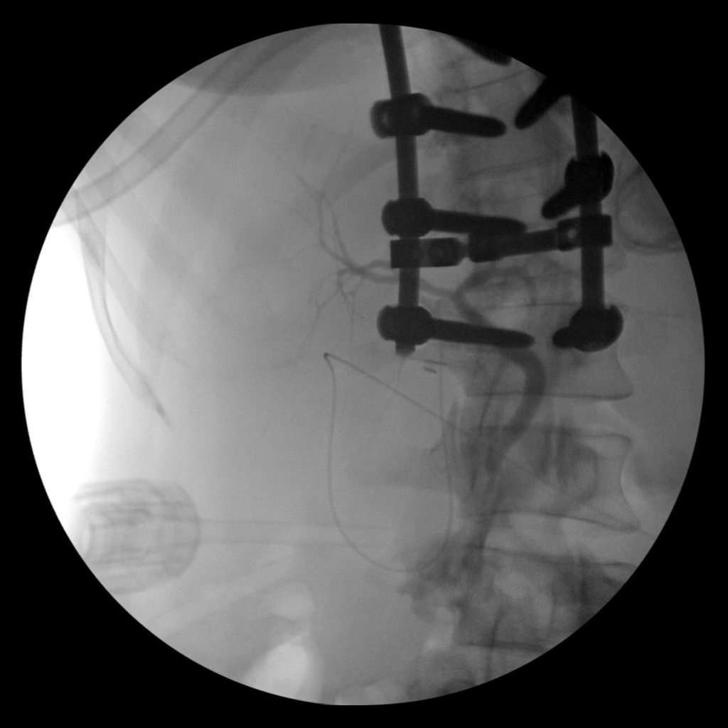
[im 1/2]
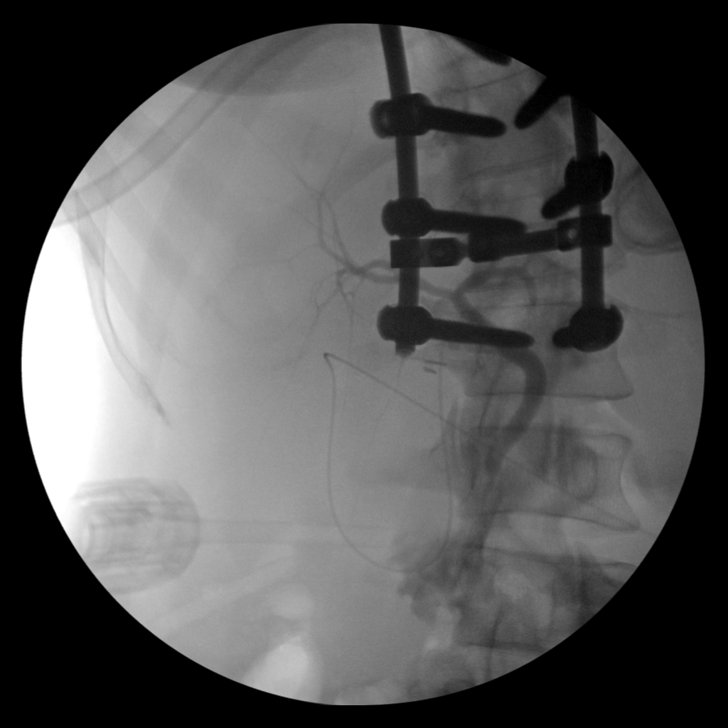
[im 1/2]
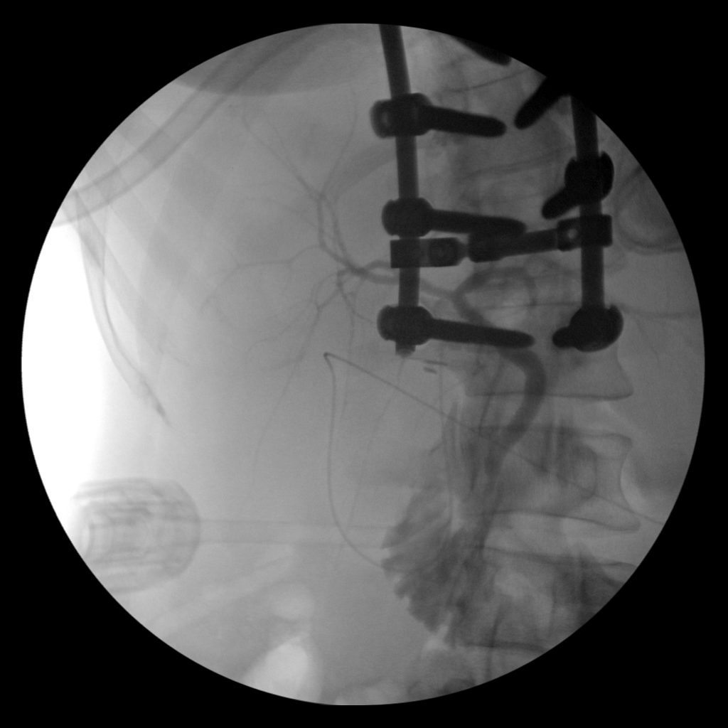
[im 2/2]
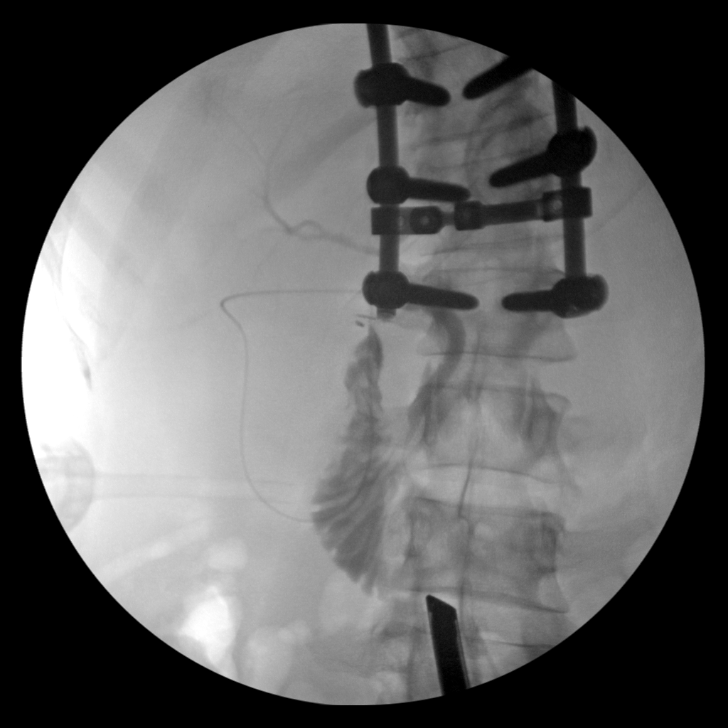
[im 2/2]
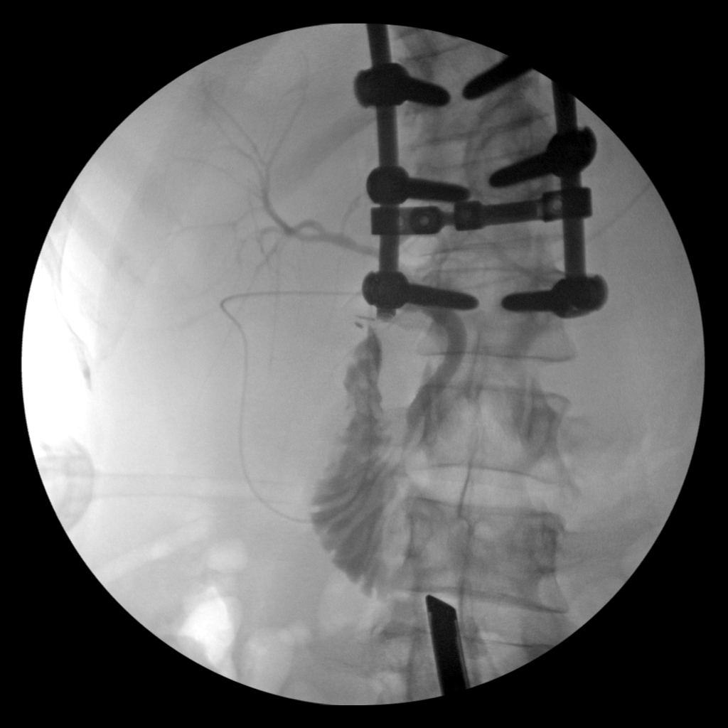
[im 2/2]
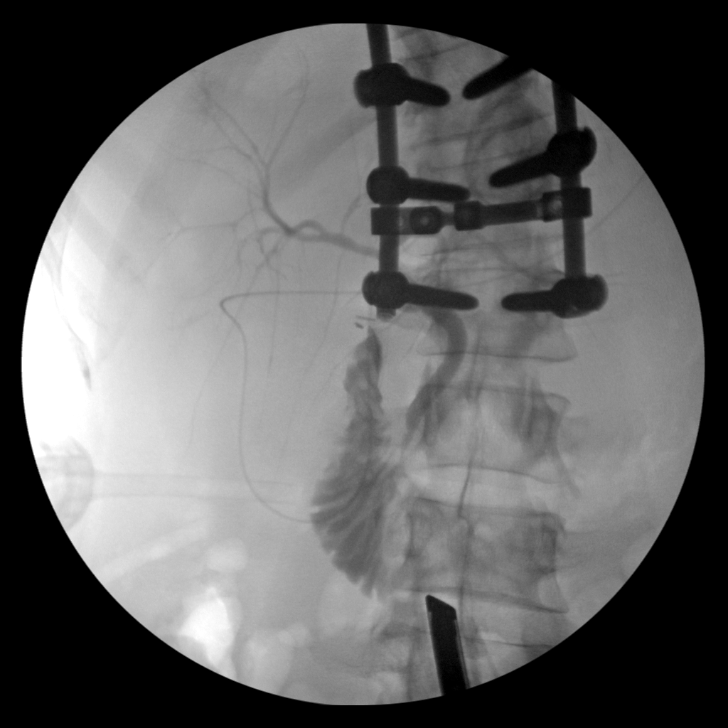
[im 2/2]
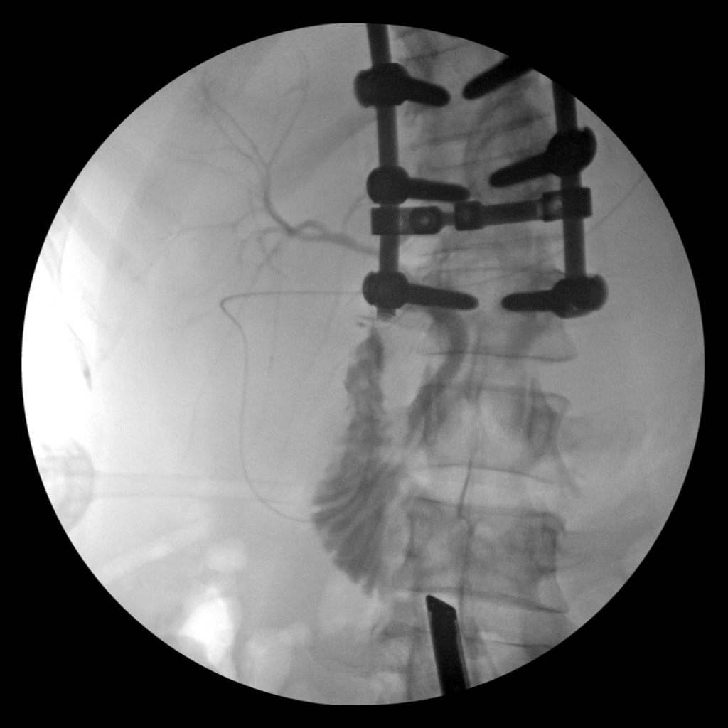

[8 of 8 positions shown; findings below may reference images not displayed]

FINDINGS: Contrast fills the biliary tree without evidence of common bile duct
filling defect.
IMPRESSION: Patent biliary tree without evidence of common bile duct stones.

## 2018-07-08 ENCOUNTER — Other Ambulatory Visit: Payer: Self-pay

## 2018-07-08 ENCOUNTER — Encounter (HOSPITAL_BASED_OUTPATIENT_CLINIC_OR_DEPARTMENT_OTHER): Payer: Self-pay

## 2018-07-08 ENCOUNTER — Emergency Department (HOSPITAL_BASED_OUTPATIENT_CLINIC_OR_DEPARTMENT_OTHER)
Admission: EM | Admit: 2018-07-08 | Discharge: 2018-07-08 | Disposition: A | Discharge status: Home / Self Care | Payer: Medicare Other | Attending: Emergency Medicine | Admitting: Emergency Medicine

## 2018-07-08 DIAGNOSIS — M419 Scoliosis, unspecified: Secondary | ICD-10-CM | POA: Diagnosis not present

## 2018-07-08 DIAGNOSIS — K5900 Constipation, unspecified: Secondary | ICD-10-CM | POA: Diagnosis present

## 2018-07-08 DIAGNOSIS — Z87891 Personal history of nicotine dependence: Secondary | ICD-10-CM | POA: Insufficient documentation

## 2018-07-08 DIAGNOSIS — Z79899 Other long term (current) drug therapy: Secondary | ICD-10-CM | POA: Insufficient documentation

## 2018-07-08 DIAGNOSIS — Z9049 Acquired absence of other specified parts of digestive tract: Secondary | ICD-10-CM | POA: Diagnosis not present

## 2018-07-08 DIAGNOSIS — K5903 Drug induced constipation: Secondary | ICD-10-CM

## 2018-07-08 LAB — COMPREHENSIVE METABOLIC PANEL
ALT: 40 U/L (ref 0–44)
AST: 32 U/L (ref 15–41)
Albumin: 4.3 g/dL (ref 3.5–5.0)
Alkaline Phosphatase: 73 U/L (ref 38–126)
Anion gap: 5 (ref 5–15)
BUN: 15 mg/dL (ref 6–20)
CALCIUM: 8.6 mg/dL — AB (ref 8.9–10.3)
CO2: 29 mmol/L (ref 22–32)
CREATININE: 0.93 mg/dL (ref 0.44–1.00)
Chloride: 101 mmol/L (ref 98–111)
GFR calc Af Amer: >60 mL/min (ref >60)
GFR calc non Af Amer: >60 mL/min (ref >60)
Glucose, Bld: 110 mg/dL — ABNORMAL HIGH (ref 70–99)
Potassium: 4.2 mmol/L (ref 3.5–5.1)
Sodium: 135 mmol/L (ref 135–145)
Total Bilirubin: 0.4 mg/dL (ref 0.3–1.2)
Total Protein: 7.4 g/dL (ref 6.5–8.1)

## 2018-07-08 LAB — CBC
HEMATOCRIT: 41.2 % (ref 36.0–46.0)
Hemoglobin: 14.1 g/dL (ref 12.0–15.0)
MCH: 31.8 pg (ref 26.0–34.0)
MCHC: 34.2 g/dL (ref 30.0–36.0)
MCV: 93 fL (ref 78.0–100.0)
Platelets: 287 10*3/uL (ref 150–400)
RBC: 4.43 MIL/uL (ref 3.87–5.11)
RDW: 12.7 % (ref 11.5–15.5)
WBC: 6.8 10*3/uL (ref 4.0–10.5)

## 2018-07-08 MED ORDER — DOCUSATE SODIUM 100 MG PO CAPS: 100.0000 mg | ORAL_CAPSULE | Freq: Two times a day (BID) | ORAL | 0 refills | Status: AC

## 2018-07-08 MED ORDER — POLYETHYLENE GLYCOL 3350 17 G PO PACK: 34.0000 g | PACK | Freq: Every day | ORAL | 0 refills | Status: AC

## 2018-07-08 MED ORDER — FLEET ENEMA 7-19 GM/118ML RE ENEM
1.0000 | ENEMA | Freq: Once | RECTAL | Status: AC
Start: 2018-07-08 — End: 2018-07-08
  Administered 2018-07-08: 1 via RECTAL
  Filled 2018-07-08: qty 1

## 2018-07-08 NOTE — ED Notes (Signed)
ED Provider at bedside. 

## 2018-07-08 NOTE — ED Triage Notes (Signed)
Pt states she has not had a BM in 3 weeks. Pt does endorses having a small one this AM and states "It was so small I wouldn't count it as one." Pt endorses abd pain, bloating. Denies decreased appetite or oral intake.

## 2018-07-08 NOTE — ED Provider Notes (Signed)
[facility] EMERGENCY DEPARTMENT Provider Note   CSN: [id number] Arrival date & time: 07/08/18  1521     History   Chief Complaint Chief Complaint  Patient presents with  . Constipation    HPI Julie Pratt is a 37 y.o. female.  HPI   Julie Pratt is a 37 year old female with a history of irritable bowel syndrome, scoliosis (takes oxycodone daily) who presents to the emergency department for evaluation of constipation.  Patient states that she has a history of constipation with taking daily oxycodone for her chronic back pain.  She reports that she usually has a bowel movement every 8 to 10 days.  States that she has not had a regular bowel movement in 3 weeks now, passed a small hard stool this morning.  Has tried taking over-the-counter stool softener, laxative and enema without relief.  She states her abdomen feels bloated and she has intermittent pressure-like abdominal pain which "moves locations."  She has had a prior cholecystectomy, no other abdominal surgeries reported.  She denies fever, chills, nausea/vomiting, hematochezia, melena, dysuria, urinary frequency, vaginal discharge, lightheadedness or syncope.   Past Medical History:  Diagnosis Date  . IBS (irritable bowel syndrome)   . Scoliosis   . UTI (urinary tract infection)     There are no active problems to display for this patient.   Past Surgical History:  Procedure Laterality Date  . CHOLECYSTECTOMY N/A 12/10/2014   Procedure: LAPAROSCOPIC CHOLECYSTECTOMY WITH INTRAOPERATIVE CHOLANGIOGRAM;  Surgeon: Manus RuddMatthew Tsuei, MD;  Location: [facility];  Service: General;  Laterality: N/A;  . HIDRANITIS BOIL REMOVAL  2012  . SPINE SURGERY  2010   SCOLIOSIS SPINAL FUSION     OB History   None      Home Medications    Prior to Admission medications   Medication Sig Start Date End Date Taking? Authorizing Provider  ALPRAZolam (XANAX) 1 MG tablet TAKE 1 TABLET BY MOUTH TWICE A DAY AS  NEEDED FOR ANXIETY 11/26/12  Yes [provider]  naloxone (NARCAN) 2 MG/2ML injection For suspected opioid overdose, spray 1 mL in each nostril.  Repeat after 3 minutes if no or minimal response. 02/25/18  Yes [provider]  oxycodone (ROXICODONE) 30 MG immediate release tablet Take 1 tablet up to five times as day as needed for pain. 12/24/14  Yes [provider]  acetaminophen (TYLENOL) 500 MG tablet Take 1,000 mg by mouth every 6 (six) hours as needed for mild pain or moderate pain.    [provider]  ALPRAZolam Prudy Feeler(XANAX) 1 MG tablet Take 1 mg by mouth 2 (two) times daily.    [provider]  dicyclomine (BENTYL) 10 MG capsule Take 10 mg by mouth 3 (three) times daily as needed for spasms.    [provider]  hyoscyamine (LEVSIN, ANASPAZ) 0.125 MG tablet Take 0.125 mg by mouth 4 (four) times daily as needed for cramping.    [provider]  oxycodone (ROXICODONE) 30 MG immediate release tablet Take 30 mg by mouth every 4 (four) hours as needed for pain.    [provider]  oxyCODONE-acetaminophen (PERCOCET/ROXICET) 5-325 MG per tablet Take 1 tablet by mouth every 4 (four) hours as needed for severe pain. 12/10/14   Manus Ruddsuei, Matthew, MD  penicillin v potassium (VEETID) 500 MG tablet Take 500 mg by mouth 2 (two) times daily.    [provider]  ranitidine (ZANTAC) 75 MG tablet Take 75 mg by mouth 2 (two) times daily.    [provider]    Family History No family history on file.  Social History Social History   Tobacco Use  . Smoking status: Former Smoker    Packs/day: 1.00    Types: Cigarettes    Last attempt to quit: 05/27/2018    Years since quitting: 0.1  . Smokeless tobacco: Never Used  Substance Use Topics  . Alcohol use: No  . Drug use: No     Allergies   Tizanidine; Doxycycline; Duloxetine; Gabapentin; and Pregabalin   Review of Systems Review of Systems  Constitutional: Negative for  chills and fever.  Respiratory: Negative for shortness of breath.   Cardiovascular: Negative for chest pain.  Gastrointestinal: Positive for abdominal pain and constipation. Negative for blood in stool, diarrhea, rectal pain and vomiting.  Genitourinary: Negative for difficulty urinating, dysuria, flank pain, frequency, pelvic pain and vaginal discharge.  Musculoskeletal: Negative for back pain.  Skin: Negative for rash.  Neurological: Negative for syncope and light-headedness.  Psychiatric/Behavioral: Negative for agitation.  All other systems reviewed and are negative.    Physical Exam Updated Vital Signs BP 115/78 (BP Location: Right Arm)   Pulse 60   Temp 98.4 F (36.9 C) (Oral)   Resp 18   Ht 5\' 9"  (1.753 m)   Wt 74.8 kg   SpO2 100%   BMI 24.37 kg/m   Physical Exam  Constitutional: She is oriented to person, place, and time. She appears well-developed and well-nourished. No distress.  Sitting at bedside in no apparent distress, non-toxic appearing.   HENT:  Head: Normocephalic and atraumatic.  Mouth/Throat: Oropharynx is clear and moist.  Mucous membranes moist  Eyes: Right eye exhibits no discharge. Left eye exhibits no discharge.  Neck: Normal range of motion.  Cardiovascular: Normal rate and regular rhythm.  No murmur heard. Pulmonary/Chest: Effort normal and breath sounds normal. No stridor. No respiratory distress. She has no wheezes. She has no rales.  Abdominal:  Abdomen soft and non-distended. Generally dull to percussion.  Bowel sounds normoactive.  Nontender to palpation.   Genitourinary:  Genitourinary Comments: Chaperone present for exam. No gross blood noted on rectal exam, normal tone, no tenderness, no mass or fissure, no hemorrhoids. Soft stool present, no impaction.    Musculoskeletal: Normal range of motion.  Neurological: She is alert and oriented to person, place, and time. Coordination normal.  Skin: Skin is warm and dry. She is not diaphoretic.   Psychiatric: She has a normal mood and affect. Her behavior is normal.  Nursing note and vitals reviewed.    ED Treatments / Results  Labs (all labs ordered are listed, but only abnormal results are displayed) Labs Reviewed  COMPREHENSIVE METABOLIC PANEL - Abnormal; Notable for the following components:      Result Value   Glucose, Bld 110 (*)    Calcium 8.6 (*)    All other components within normal limits  CBC    EKG None  Radiology No results found.  Procedures Procedures (including critical care time)  Medications Ordered in ED Medications  sodium phosphate (FLEET) 7-19 GM/118ML enema 1 enema (1 enema Rectal Given 07/08/18 1715)     Initial Impression / Assessment and Plan / ED Course  I have reviewed the triage vital signs and the nursing notes.  Pertinent labs & imaging results that were available during my care of the patient were reviewed by me and considered in my medical decision making (see chart for details).    Presents with constipation, likely related to chronic oxycodone  use.  Reports intermittent abdominal pain with associated bloating.  On exam no focal tenderness.  Lab work reassuring.  No leukocytosis.  No major electrolyte abnormalities. Kidney function and liver enzymes within normal limits.  Patient reports a bowel movement in the emergency department after Fleet enema.  States that she feels improved.  Doubt acute intraabdominal etiology requiring further lab or imaging work up. Plan to discharge home with MiraLAX and Colace.  Counseled her on return precautions and she agrees and appears reliable.  Final Clinical Impressions(s) / ED Diagnoses   Final diagnoses:  Drug-induced constipation    ED Discharge Orders         Ordered    docusate sodium (COLACE) 100 MG capsule  Every 12 hours     07/08/18 1811    polyethylene glycol (MIRALAX) packet  Daily     07/08/18 1811           Kellie ShropshireShrosbree, Love Milbourne J, PA-C 07/08/18 1812    Melene PlanFloyd, Dan,  DO 07/08/18 2012

## 2018-07-08 NOTE — Discharge Instructions (Signed)
The oxycodone that you take can make you constipated.  I written you prescription for MiraLAX and Colace.  Please take these daily.  Please return to the ER if you have any new or concerning symptoms like worsening abdominal pain, fever, vomiting.

## 2023-09-03 ENCOUNTER — Ambulatory Visit (INDEPENDENT_AMBULATORY_CARE_PROVIDER_SITE_OTHER): Payer: Medicare Other | Admitting: Podiatry

## 2023-09-03 ENCOUNTER — Ambulatory Visit: Payer: Medicare Other

## 2023-09-03 DIAGNOSIS — M2142 Flat foot [pes planus] (acquired), left foot: Secondary | ICD-10-CM

## 2023-09-03 DIAGNOSIS — M19072 Primary osteoarthritis, left ankle and foot: Secondary | ICD-10-CM

## 2023-09-03 DIAGNOSIS — M7752 Other enthesopathy of left foot: Secondary | ICD-10-CM

## 2023-09-03 MED ORDER — TRIAMCINOLONE ACETONIDE 10 MG/ML IJ SUSP
10.0000 mg | Freq: Once | INTRAMUSCULAR | Status: AC
  Administered 2023-09-03: 10 mg via INTRA_ARTICULAR

## 2023-09-04 NOTE — Progress Notes (Signed)
Subjective:   Patient ID: Julie Pratt, female   DOB: 42 y.o.   MRN: [id number]   HPI Patient presents stating she has had a lot of pain in her left ankle and at times in her foot.  States this has been going on now for about a year and a half gradually becoming more of an issue and she has tried shoe gear modifications anti-inflammatories.  Patient does not smoke tries to be active   Review of Systems  All other systems reviewed and are negative.       Objective:  Physical Exam Vitals and nursing note reviewed.  Constitutional:      Appearance: She is well-developed.  Pulmonary:     Effort: Pulmonary effort is normal.  Musculoskeletal:        General: Normal range of motion.  Skin:    General: Skin is warm.  Neurological:     Mental Status: She is alert.     Neurovascular status intact muscle strength was found to be adequate range of motion adequate with patient found to have exquisite discomfort left sinus tarsi with fluid buildup and reduced range of motion of the subtalar joint left in comparison to right no crepitus of the joint minimal forefoot pain noted.  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

## 2024-04-16 ENCOUNTER — Ambulatory Visit (INDEPENDENT_AMBULATORY_CARE_PROVIDER_SITE_OTHER): Admitting: Podiatry

## 2024-04-16 ENCOUNTER — Encounter: Payer: Self-pay | Admitting: Podiatry

## 2024-04-16 VITALS — Ht 69.0 in | Wt 165.0 lb

## 2024-04-16 DIAGNOSIS — M7752 Other enthesopathy of left foot: Secondary | ICD-10-CM

## 2024-04-16 MED ORDER — TRIAMCINOLONE ACETONIDE 10 MG/ML IJ SUSP
10.0000 mg | Freq: Once | INTRAMUSCULAR | Status: AC
  Administered 2024-04-16: 10 mg via INTRA_ARTICULAR

## 2024-04-17 NOTE — Progress Notes (Signed)
 Subjective:   Patient ID: Julie Pratt, female   DOB: 43 y.o.   MRN: [id number]   HPI Patient states starting to have pain again but did well for around 6 months   ROS      Objective:  Physical Exam  Neurovascular status intact inflammation pain of the sinus tarsi without fluid buildup within the joint     Assessment:  Inflammatory capsulitis of the sinus tarsi left     Plan:  Sterile prep injected the capsule 3 mg Kenalog  5 mg Xylocaine  applied sterile dressing

## 2024-10-15 ENCOUNTER — Encounter: Payer: Self-pay | Admitting: Podiatry

## 2024-10-15 ENCOUNTER — Ambulatory Visit (INDEPENDENT_AMBULATORY_CARE_PROVIDER_SITE_OTHER): Admitting: Podiatry

## 2024-10-15 DIAGNOSIS — M7752 Other enthesopathy of left foot: Secondary | ICD-10-CM | POA: Diagnosis not present

## 2024-10-15 MED ORDER — TRIAMCINOLONE ACETONIDE 10 MG/ML IJ SUSP
10.0000 mg | Freq: Once | INTRAMUSCULAR | Status: AC
  Administered 2024-10-15: 10 mg via INTRA_ARTICULAR

## 2024-10-16 NOTE — Progress Notes (Signed)
 Subjective:   Patient ID: Julie Pratt, female   DOB: 43 y.o.   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
